# Patient Record
Sex: Female | Born: 1970 | Race: White | Hispanic: No | Marital: Married | Smoking: Never smoker
Health system: Southern US, Community
[De-identification: ages and names within clinical notes are randomized; demographics above are authoritative.]

## PROBLEM LIST (undated history)

## (undated) DIAGNOSIS — E039 Hypothyroidism, unspecified: Secondary | ICD-10-CM

## (undated) HISTORY — PX: APPENDECTOMY: SHX54

## (undated) HISTORY — PX: OTHER SURGICAL HISTORY: SHX169

## (undated) HISTORY — PX: ABDOMINAL HYSTERECTOMY: SHX81

---

## 1999-04-29 ENCOUNTER — Other Ambulatory Visit: Admission: RE | Admit: 1999-04-29 | Discharge: 1999-04-29 | Payer: Self-pay | Admitting: Family Medicine

## 2001-02-03 ENCOUNTER — Ambulatory Visit (HOSPITAL_BASED_OUTPATIENT_CLINIC_OR_DEPARTMENT_OTHER): Admission: RE | Admit: 2001-02-03 | Discharge: 2001-02-03 | Payer: Self-pay | Admitting: General Surgery

## 2001-02-03 ENCOUNTER — Encounter (INDEPENDENT_AMBULATORY_CARE_PROVIDER_SITE_OTHER): Payer: Self-pay | Admitting: Specialist

## 2001-11-15 ENCOUNTER — Encounter: Payer: Self-pay | Admitting: General Surgery

## 2001-11-15 ENCOUNTER — Encounter: Admission: RE | Admit: 2001-11-15 | Discharge: 2001-11-15 | Payer: Self-pay | Admitting: General Surgery

## 2003-08-15 ENCOUNTER — Other Ambulatory Visit: Admission: RE | Admit: 2003-08-15 | Discharge: 2003-08-15 | Payer: Self-pay | Admitting: Family Medicine

## 2004-08-26 ENCOUNTER — Other Ambulatory Visit: Admission: RE | Admit: 2004-08-26 | Discharge: 2004-08-26 | Payer: Self-pay | Admitting: Family Medicine

## 2005-09-08 ENCOUNTER — Other Ambulatory Visit: Admission: RE | Admit: 2005-09-08 | Discharge: 2005-09-08 | Payer: Self-pay | Admitting: Family Medicine

## 2006-09-14 ENCOUNTER — Other Ambulatory Visit: Admission: RE | Admit: 2006-09-14 | Discharge: 2006-09-14 | Payer: Self-pay | Admitting: Family Medicine

## 2007-09-27 ENCOUNTER — Other Ambulatory Visit: Admission: RE | Admit: 2007-09-27 | Discharge: 2007-09-27 | Payer: Self-pay | Admitting: Family Medicine

## 2008-10-02 ENCOUNTER — Other Ambulatory Visit: Admission: RE | Admit: 2008-10-02 | Discharge: 2008-10-02 | Payer: Self-pay | Admitting: Family Medicine

## 2008-11-29 ENCOUNTER — Ambulatory Visit (HOSPITAL_COMMUNITY): Admission: RE | Admit: 2008-11-29 | Discharge: 2008-11-29 | Payer: Self-pay | Admitting: Obstetrics

## 2009-02-01 ENCOUNTER — Encounter (INDEPENDENT_AMBULATORY_CARE_PROVIDER_SITE_OTHER): Payer: Self-pay | Admitting: Obstetrics

## 2009-02-01 ENCOUNTER — Ambulatory Visit (HOSPITAL_COMMUNITY): Admission: RE | Admit: 2009-02-01 | Discharge: 2009-02-01 | Payer: Self-pay | Admitting: Obstetrics

## 2010-05-23 ENCOUNTER — Other Ambulatory Visit: Admission: RE | Admit: 2010-05-23 | Discharge: 2010-05-23 | Payer: Self-pay | Admitting: Family Medicine

## 2010-07-10 ENCOUNTER — Ambulatory Visit (HOSPITAL_COMMUNITY)
Admission: RE | Admit: 2010-07-10 | Discharge: 2010-07-10 | Payer: Self-pay | Source: Home / Self Care | Attending: Family Medicine | Admitting: Family Medicine

## 2010-11-02 LAB — CBC
HCT: 42.3 % (ref 36.0–46.0)
Hemoglobin: 14.7 g/dL (ref 12.0–15.0)
MCHC: 34.7 g/dL (ref 30.0–36.0)
MCV: 88.6 fL (ref 78.0–100.0)
Platelets: 300 10*3/uL (ref 150–400)
RBC: 4.77 MIL/uL (ref 3.87–5.11)
RDW: 12.7 % (ref 11.5–15.5)
WBC: 9 10*3/uL (ref 4.0–10.5)

## 2010-11-02 LAB — BASIC METABOLIC PANEL
BUN: 5 mg/dL — ABNORMAL LOW (ref 6–23)
CO2: 27 mEq/L (ref 19–32)
Calcium: 8.9 mg/dL (ref 8.4–10.5)
Chloride: 105 mEq/L (ref 96–112)
Creatinine, Ser: 0.65 mg/dL (ref 0.4–1.2)
GFR calc Af Amer: >60 mL/min (ref >60)
GFR calc non Af Amer: >60 mL/min (ref >60)
Glucose, Bld: 94 mg/dL (ref 70–99)
Potassium: 4.2 mEq/L (ref 3.5–5.1)
Sodium: 138 mEq/L (ref 135–145)

## 2010-11-02 LAB — ABO/RH: ABO/RH(D): B POS

## 2010-11-02 LAB — TYPE AND SCREEN: Antibody Screen: NEGATIVE

## 2010-11-02 LAB — URINALYSIS, ROUTINE W REFLEX MICROSCOPIC
Hgb urine dipstick: NEGATIVE
Ketones, ur: NEGATIVE mg/dL
Protein, ur: NEGATIVE mg/dL
Urobilinogen, UA: 0.2 mg/dL (ref 0.0–1.0)

## 2010-11-05 ENCOUNTER — Ambulatory Visit (HOSPITAL_COMMUNITY)
Admission: RE | Admit: 2010-11-05 | Discharge: 2010-11-05 | Disposition: A | Discharge status: Home / Self Care | Payer: BC Managed Care – PPO | Source: Ambulatory Visit | Attending: Obstetrics and Gynecology | Admitting: Obstetrics and Gynecology

## 2010-11-05 ENCOUNTER — Other Ambulatory Visit: Payer: Self-pay | Admitting: Obstetrics and Gynecology

## 2010-11-05 ENCOUNTER — Ambulatory Visit (HOSPITAL_COMMUNITY): Payer: BC Managed Care – PPO

## 2010-11-05 DIAGNOSIS — N8000 Endometriosis of the uterus, unspecified: Secondary | ICD-10-CM | POA: Insufficient documentation

## 2010-11-05 DIAGNOSIS — N979 Female infertility, unspecified: Secondary | ICD-10-CM

## 2010-11-05 DIAGNOSIS — D250 Submucous leiomyoma of uterus: Secondary | ICD-10-CM | POA: Insufficient documentation

## 2010-11-05 LAB — CBC
HCT: 42.7 % (ref 36.0–46.0)
MCH: 29.2 pg (ref 26.0–34.0)
MCHC: 33.5 g/dL (ref 30.0–36.0)
MCV: 87.3 fL (ref 78.0–100.0)
Platelets: 307 10*3/uL (ref 150–400)
RDW: 12.5 % (ref 11.5–15.5)
WBC: 11.9 10*3/uL — ABNORMAL HIGH (ref 4.0–10.5)

## 2010-12-09 NOTE — Op Note (Signed)
NAMETAMEIKA, HECKMANN                ACCOUNT NO.:  [id number]   MEDICAL RECORD NO.:  [id number]          PATIENT TYPE:  AMB   LOCATION:  SDC                           FACILITY:  [facility]   PHYSICIAN:  Lendon Colonel, MD   DATE OF BIRTH:  August 16, 1970   DATE OF PROCEDURE:  02/01/2009  DATE OF DISCHARGE:  02/01/2009                               OPERATIVE REPORT   PREOPERATIVE DIAGNOSIS:  Menorrhagia, submucosal fibroid.   POSTOPERATIVE DIAGNOSIS:  Menorrhagia, submucosal fibroid.   PROCEDURE:  Operative hysteroscopy, resectoscope.   SURGEON:  Alphonsus Sias. Ernestina Penna, MD   ASSISTANT:  None.   ANESTHESIA:  Epidural.   FINDINGS:  Normal cervix, uterus was sounded to 9 cm.  Normal left  ostia. Visualization of the right ostia was blocked by submucosal  fibroid. Somewhat thickened endometrium.  No fundal distortion from the  larger intramural fibroid,  3-cm fibroid with a 0.5 cm stalk in the  right cornu.   SPECIMENS:  Fibroids, EMC.   DISPOSITION:  To Pathology.   ANTIBIOTICS:  100 mg of IV doxycycline.   ESTIMATED BLOOD LOSS:  Minimal.   FLUIDS:  90 mL hysteroscopic saline deficit and 580 hysteroscopic  glycine deficit.   COMPLICATIONS:  None.   INDICATIONS:  This is a 40 year old G0 with history of menorrhagia and  found to have a 3-cm submucosal fibroid on sonohystogram.   DESCRIPTION OF PROCEDURE:  After informed consent was obtained, the  patient was taken to the operating room where epidural anesthesia was  initiated without difficulty.  She was prepped and draped in normal  sterile fashion in dorsal supine lithotomy position.  A speculum was  inserted into the vagina.  Single-toothed tenaculum was used to grasp  the anterior lip of cervix and solution 6 units of vasopressin mixed ino  30 mL of 0.5% Marcaine was used, 10 mL of the solution was infused at 5  and 7 o'clock in the cervical and paracervical junction.  The uterus was  sounded, was serially dilated, and then a small  hysteroscope was used to  evaluate the uterus.  Several minutes were spent trying to visualize the  right ostia, however, this was unsuccessful.  The remainder of the  findings were as noted earlier in the dictation.  The hysteroscope was  removed.  The resectoscope was inserted with a 90-degree right angle  loop, attempts at transecting the fibroid at its stalk was done.  The  majority of the stalk was transected, however, the fibroid was unable to  be amputated from the uterus.  Given increasing fluid deficit and  concern for inability to complete the procedure, the right angle loop  was used in traditional fashion to serially resect the fibroid, about  60% of the fibroid was removed.  The resectoscope was removed in and out  of the cervix multiple times. This was needed for cleaning and to remove  chips of the fibroid.  Total procedure time went about 1 hour.  After  the glycine deficit had reached over 500 mL, the resectoscope portion of  the procedure was terminated.  Given  there was still some fibroid  remaining, saline was used with a small operative hysteroscope with  attempts to further transect the fibroid, a small amount of fibroid was  removed with this as well as bluntly placing a polyp forceps and  curettage of the endometrium.  At this point, the procedure was  terminated.  Tenaculum was removed.  Silver nitrate was placed on the  tenaculum site.  The patient was awoke in the procedure.  The patient  tolerated well and was taken to recovery room in stable condition.      Lendon Colonel, MD  Electronically Signed     KAF/MEDQ  D:  02/01/2009  T:  02/02/2009  Job:  [id number]

## 2010-12-12 NOTE — Op Note (Signed)
[facility]  Patient:    Kathleen Stephenson, Kathleen Stephenson                         MRN: [id number] Proc. Date: 02/03/01 Adm. Date:  [id number] Disc. Date: [id number] Attending:  Caleen Essex                           Operative Report  PREOPERATIVE DIAGNOSIS:  Mass in the left lower back.  POSTOPERATIVE DIAGNOSIS:  Mass in the left lower back.  OPERATION PERFORMED:  Excision of a 2 cm mass in the left back.  SURGEON:  Chevis Pretty, M.D.  ANESTHESIA:  Local.  DESCRIPTION OF PROCEDURE:  After informed consent was obtained, the patient was brought to the operating room and placed in the prone position on the operating table.  The patients area in question on the left back was prepped with Betadine and draped in the usual sterile manner.  The area in question was then infiltrated with 1% lidocaine with epinephrine.  This was then massaged into the tissues for several minutes.  A small transverse incision was made overtop of the palpable mass.  The incision was carried down through the skin and subcutaneous using both sharp dissection with the bipolar electrocautery and blunt dissection with a hemostat until the area in question was identified.  The area in question had the appearance of a lipoma. Everything that was palpable was removed with a combination of blunt dissection with the Metzenbaum scissors.  The wound was then evaluated and found to be hemostatic.  The deep layer was closed with interrupted with a running 3-0 Vicryl stitch and the skin incision was closed with running 4-0 Monocryl subcuticular stitch.  Benzoin and Steri-Strips and a sterile dressing was applied.  The patient tolerated the procedure well.  At the end of the case all sponge, needle and instrument counts were verified correct at the completion of the operation.  The patient was taken to the recovery room in stable condition. DD:  02/07/01 TD:  02/07/01 Job: [id number]

## 2011-02-27 NOTE — Op Note (Signed)
Kathleen Stephenson, BOERNER                ACCOUNT NO.:  [id number]  MEDICAL RECORD NO.:  [id number]           PATIENT TYPE:  O  LOCATION:  [facility]                          [facility]ILITY:  [facility]  PHYSICIAN:  Fermin Schwab, MD   DATE OF BIRTH:  [birth year]  DATE OF PROCEDURE:  11/05/2010 DATE OF DISCHARGE:                              OPERATIVE REPORT   PREOPERATIVE DIAGNOSIS:  Submucosal myoma versus intrauterine adhesion.  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  PROCEDURES:  Hysteroscopy.  Intraoperative HSG.  Resection of submucosal adenomyoma.  SURGEON:  Fermin Schwab, MD  ANESTHESIA:  General.  FINDINGS:  On exam under anesthesia, the external genitalia, Bartholin, Skene, urethra were normal.  The vagina was normal.  Cervix appeared grossly normal.  On hysteroscopy, endocervical canal was normal.  The endometrial cavity for was arcuate.  The left cornual region was normal. The left tubal ostium was visualized.  The right tubal ostium was covered with a 1.5 x 1.5 cm oval dense structure that was connected/adherent to the anterior fundal region as well as posterior lateral aspect of the right cornu.  Upon dissection VersaPoint resectoscope, this turned out to be a adenomyoma.  On intraoperative HSG, the endometrial cavity seemed to be obliterated by a quarter of its surface area by this right cornual filling defect, although the contrast went behind it and into the right tube and the right tube spilled.  The left tube filled up to mid ampulla in a sluggish manner, but did not spill.  There was no left tubal distention. This was followed using a technical problem.  DESCRIPTION OF PROCEDURE:  The patient was placed in lithotomy position. General LMA anesthesia was given.  A 2 grams of cefoxitin were given intravenously for prophylaxis.  PROCEDURE:  The patient was prepped and draped in sterile manner with 0.4 units per mL  of dilute vasopressin injection was given to the cervix at 30 degree slimline hysteroscope with normal saline distention medium via hysteroscopic pump was used for hysteroscopy above findings were noted.  Next, the uterus sounded to 11 cm, and its base was dilated to 30-French with Shawnie Pons dilators.  The 12-degree VersaPoint resectoscope was inserted 90 using normal saline.  The mass was resected with careful sleeves.  When I reached the center of it, a large amount of dark brown, thick material escaped indicating that this was cystic adenomyoma.  The location of this mass corresponded to the site where 2 previous submucosal myoma sections were performed.  Of interest, the mass appeared to be attached both fundally anteriorly and also posterior and laterally leaving a recess that went into the right tubal ostium. Multiple sequences of uterine deflation and reinflation with distention media was performed in order to encourage any intramural portion of this mass to ooze out.  Although resection could be carried out to the normal myometrium superiorly, at the posterior and lateral aspect, normal tissue could not be reached.  For the sake of uterine integrity, resection in this direction was stopped and the procedure was terminated.  The small chips of tissue were collected and  sent to Pathology.  A Seprafilm sheath was rolled up and inserted into the upper uterine cavity.  Estimated blood loss was minimal.  The patient tolerated the procedure well and was transferred to recovery room in satisfactory condition.     Fermin Schwab, MD     TY/MEDQ  D:  11/05/2010  T:  11/06/2010  Job:  [id number]  cc:   Huel Cote, M.D. Fax: [phone]  Electronically Signed by Fermin Schwab MD on 02/27/2011 04:11:27 PM

## 2011-08-04 ENCOUNTER — Other Ambulatory Visit (HOSPITAL_COMMUNITY): Payer: Self-pay | Admitting: Family Medicine

## 2011-08-04 DIAGNOSIS — Z1231 Encounter for screening mammogram for malignant neoplasm of breast: Secondary | ICD-10-CM

## 2011-09-01 ENCOUNTER — Ambulatory Visit (HOSPITAL_COMMUNITY)
Admission: RE | Admit: 2011-09-01 | Discharge: 2011-09-01 | Disposition: A | Discharge status: Home / Self Care | Payer: BC Managed Care – PPO | Source: Ambulatory Visit | Attending: Family Medicine | Admitting: Family Medicine

## 2011-09-01 DIAGNOSIS — Z1231 Encounter for screening mammogram for malignant neoplasm of breast: Secondary | ICD-10-CM

## 2012-01-22 ENCOUNTER — Other Ambulatory Visit: Payer: Self-pay | Admitting: Family Medicine

## 2012-01-22 ENCOUNTER — Other Ambulatory Visit (HOSPITAL_COMMUNITY)
Admission: RE | Admit: 2012-01-22 | Discharge: 2012-01-22 | Disposition: A | Discharge status: Home / Self Care | Payer: BC Managed Care – PPO | Source: Ambulatory Visit | Attending: Family Medicine | Admitting: Family Medicine

## 2012-01-22 DIAGNOSIS — Z124 Encounter for screening for malignant neoplasm of cervix: Secondary | ICD-10-CM | POA: Insufficient documentation

## 2012-09-06 ENCOUNTER — Other Ambulatory Visit: Payer: Self-pay | Admitting: Family Medicine

## 2013-01-02 ENCOUNTER — Other Ambulatory Visit (HOSPITAL_COMMUNITY): Payer: Self-pay | Admitting: Family Medicine

## 2013-01-02 DIAGNOSIS — Z1231 Encounter for screening mammogram for malignant neoplasm of breast: Secondary | ICD-10-CM

## 2013-01-10 ENCOUNTER — Ambulatory Visit (HOSPITAL_COMMUNITY)
Admission: RE | Admit: 2013-01-10 | Discharge: 2013-01-10 | Disposition: A | Discharge status: Home / Self Care | Payer: BC Managed Care – PPO | Source: Ambulatory Visit | Attending: Family Medicine | Admitting: Family Medicine

## 2013-01-10 DIAGNOSIS — Z1231 Encounter for screening mammogram for malignant neoplasm of breast: Secondary | ICD-10-CM | POA: Insufficient documentation

## 2013-01-25 ENCOUNTER — Other Ambulatory Visit: Payer: Self-pay | Admitting: Family Medicine

## 2013-01-25 ENCOUNTER — Other Ambulatory Visit (HOSPITAL_COMMUNITY)
Admission: RE | Admit: 2013-01-25 | Discharge: 2013-01-25 | Disposition: A | Discharge status: Home / Self Care | Payer: BC Managed Care – PPO | Source: Ambulatory Visit | Attending: Family Medicine | Admitting: Family Medicine

## 2013-01-25 DIAGNOSIS — Z124 Encounter for screening for malignant neoplasm of cervix: Secondary | ICD-10-CM | POA: Insufficient documentation

## 2014-01-08 ENCOUNTER — Other Ambulatory Visit (HOSPITAL_COMMUNITY): Payer: Self-pay | Admitting: Family Medicine

## 2014-01-08 DIAGNOSIS — Z1231 Encounter for screening mammogram for malignant neoplasm of breast: Secondary | ICD-10-CM

## 2014-01-17 ENCOUNTER — Ambulatory Visit (HOSPITAL_COMMUNITY)
Admission: RE | Admit: 2014-01-17 | Discharge: 2014-01-17 | Disposition: A | Discharge status: Home / Self Care | Payer: BC Managed Care – PPO | Source: Ambulatory Visit | Attending: Family Medicine | Admitting: Family Medicine

## 2014-01-17 DIAGNOSIS — Z1231 Encounter for screening mammogram for malignant neoplasm of breast: Secondary | ICD-10-CM

## 2015-02-27 ENCOUNTER — Other Ambulatory Visit (HOSPITAL_COMMUNITY): Payer: Self-pay | Admitting: Family Medicine

## 2015-02-27 DIAGNOSIS — R102 Pelvic and perineal pain: Secondary | ICD-10-CM

## 2015-03-05 ENCOUNTER — Other Ambulatory Visit (HOSPITAL_COMMUNITY): Payer: Self-pay | Admitting: Family Medicine

## 2015-03-05 DIAGNOSIS — Z1231 Encounter for screening mammogram for malignant neoplasm of breast: Secondary | ICD-10-CM

## 2015-03-08 ENCOUNTER — Ambulatory Visit (HOSPITAL_COMMUNITY): Payer: BC Managed Care – PPO

## 2015-03-13 ENCOUNTER — Ambulatory Visit (HOSPITAL_COMMUNITY)
Admission: RE | Admit: 2015-03-13 | Discharge: 2015-03-13 | Disposition: A | Discharge status: Home / Self Care | Payer: BC Managed Care – PPO | Source: Ambulatory Visit | Attending: Family Medicine | Admitting: Family Medicine

## 2015-03-13 DIAGNOSIS — R102 Pelvic and perineal pain: Secondary | ICD-10-CM

## 2015-03-13 DIAGNOSIS — D252 Subserosal leiomyoma of uterus: Secondary | ICD-10-CM | POA: Insufficient documentation

## 2015-03-20 ENCOUNTER — Ambulatory Visit (HOSPITAL_COMMUNITY)
Admission: RE | Admit: 2015-03-20 | Discharge: 2015-03-20 | Disposition: A | Discharge status: Home / Self Care | Payer: BC Managed Care – PPO | Source: Ambulatory Visit | Attending: Family Medicine | Admitting: Family Medicine

## 2015-03-20 DIAGNOSIS — Z1231 Encounter for screening mammogram for malignant neoplasm of breast: Secondary | ICD-10-CM | POA: Insufficient documentation

## 2016-02-10 ENCOUNTER — Other Ambulatory Visit (HOSPITAL_COMMUNITY)
Admission: RE | Admit: 2016-02-10 | Discharge: 2016-02-10 | Disposition: A | Discharge status: Home / Self Care | Payer: BC Managed Care – PPO | Source: Ambulatory Visit | Attending: Family Medicine | Admitting: Family Medicine

## 2016-02-10 ENCOUNTER — Other Ambulatory Visit: Payer: Self-pay | Admitting: Family Medicine

## 2016-02-10 DIAGNOSIS — Z01411 Encounter for gynecological examination (general) (routine) with abnormal findings: Secondary | ICD-10-CM | POA: Insufficient documentation

## 2016-02-10 DIAGNOSIS — Z1151 Encounter for screening for human papillomavirus (HPV): Secondary | ICD-10-CM | POA: Insufficient documentation

## 2016-02-11 LAB — CYTOLOGY - PAP

## 2016-05-14 ENCOUNTER — Other Ambulatory Visit: Payer: Self-pay | Admitting: Family Medicine

## 2016-05-14 DIAGNOSIS — Z1231 Encounter for screening mammogram for malignant neoplasm of breast: Secondary | ICD-10-CM

## 2016-06-04 ENCOUNTER — Ambulatory Visit
Admission: RE | Admit: 2016-06-04 | Discharge: 2016-06-04 | Disposition: A | Discharge status: Home / Self Care | Payer: BC Managed Care – PPO | Source: Ambulatory Visit | Attending: Family Medicine | Admitting: Family Medicine

## 2016-06-04 DIAGNOSIS — Z1231 Encounter for screening mammogram for malignant neoplasm of breast: Secondary | ICD-10-CM

## 2017-05-25 ENCOUNTER — Other Ambulatory Visit: Payer: Self-pay | Admitting: Family Medicine

## 2017-05-25 DIAGNOSIS — Z1231 Encounter for screening mammogram for malignant neoplasm of breast: Secondary | ICD-10-CM

## 2017-06-16 ENCOUNTER — Ambulatory Visit: Payer: BC Managed Care – PPO

## 2017-06-22 ENCOUNTER — Ambulatory Visit
Admission: RE | Admit: 2017-06-22 | Discharge: 2017-06-22 | Disposition: A | Discharge status: Home / Self Care | Payer: BC Managed Care – PPO | Source: Ambulatory Visit | Attending: Family Medicine | Admitting: Family Medicine

## 2017-06-22 DIAGNOSIS — Z1231 Encounter for screening mammogram for malignant neoplasm of breast: Secondary | ICD-10-CM

## 2018-05-26 ENCOUNTER — Other Ambulatory Visit: Payer: Self-pay | Admitting: Family Medicine

## 2018-05-26 DIAGNOSIS — Z1231 Encounter for screening mammogram for malignant neoplasm of breast: Secondary | ICD-10-CM

## 2018-07-12 ENCOUNTER — Ambulatory Visit
Admission: RE | Admit: 2018-07-12 | Discharge: 2018-07-12 | Disposition: A | Discharge status: Home / Self Care | Payer: BC Managed Care – PPO | Source: Ambulatory Visit | Attending: Family Medicine | Admitting: Family Medicine

## 2018-07-12 DIAGNOSIS — Z1231 Encounter for screening mammogram for malignant neoplasm of breast: Secondary | ICD-10-CM

## 2019-03-31 ENCOUNTER — Other Ambulatory Visit (HOSPITAL_COMMUNITY)
Admission: RE | Admit: 2019-03-31 | Discharge: 2019-03-31 | Disposition: A | Discharge status: Home / Self Care | Payer: BC Managed Care – PPO | Source: Ambulatory Visit | Attending: Obstetrics and Gynecology | Admitting: Obstetrics and Gynecology

## 2019-03-31 ENCOUNTER — Other Ambulatory Visit: Payer: Self-pay | Admitting: Obstetrics and Gynecology

## 2019-03-31 DIAGNOSIS — Z01419 Encounter for gynecological examination (general) (routine) without abnormal findings: Secondary | ICD-10-CM | POA: Insufficient documentation

## 2019-04-05 LAB — CYTOLOGY - PAP
Diagnosis: NEGATIVE
HPV: NOT DETECTED

## 2019-06-26 ENCOUNTER — Other Ambulatory Visit: Payer: Self-pay | Admitting: Family Medicine

## 2019-06-26 DIAGNOSIS — Z1231 Encounter for screening mammogram for malignant neoplasm of breast: Secondary | ICD-10-CM

## 2019-08-15 ENCOUNTER — Other Ambulatory Visit: Payer: Self-pay

## 2019-08-15 ENCOUNTER — Ambulatory Visit
Admission: RE | Admit: 2019-08-15 | Discharge: 2019-08-15 | Disposition: A | Discharge status: Home / Self Care | Payer: BC Managed Care – PPO | Source: Ambulatory Visit | Attending: Family Medicine | Admitting: Family Medicine

## 2019-08-15 DIAGNOSIS — Z1231 Encounter for screening mammogram for malignant neoplasm of breast: Secondary | ICD-10-CM

## 2020-01-08 ENCOUNTER — Encounter (HOSPITAL_COMMUNITY)
Admission: RE | Admit: 2020-01-08 | Discharge: 2020-01-08 | Disposition: A | Discharge status: Home / Self Care | Payer: BC Managed Care – PPO | Source: Ambulatory Visit | Attending: Obstetrics and Gynecology | Admitting: Obstetrics and Gynecology

## 2020-01-08 ENCOUNTER — Encounter (HOSPITAL_COMMUNITY): Payer: Self-pay

## 2020-01-08 ENCOUNTER — Encounter (HOSPITAL_COMMUNITY): Payer: BC Managed Care – PPO

## 2020-01-08 ENCOUNTER — Other Ambulatory Visit: Payer: Self-pay

## 2020-01-08 DIAGNOSIS — Z01812 Encounter for preprocedural laboratory examination: Secondary | ICD-10-CM | POA: Insufficient documentation

## 2020-01-08 HISTORY — DX: Hypothyroidism, unspecified: E03.9

## 2020-01-08 NOTE — Progress Notes (Signed)
COVID Vaccine Completed:yes Date COVID Vaccine completed:09/27/19 COVID vaccine manufacturer: [place]   PCP - Maurice Small Cardiologist -   Chest x-ray -  EKG -  Stress Test -  ECHO -  Cardiac Cath -   Sleep Study -  CPAP -   Fasting Blood Sugar -  Checks Blood Sugar _____ times a day  Blood Thinner Instructions: Aspirin Instructions: Last Dose:  Anesthesia review:   Patient denies shortness of breath, fever, cough and chest pain at PAT appointment   Patient verbalized understanding of instructions that were given to them at the PAT appointment. Patient was also instructed that they will need to review over the PAT instructions again at home before surgery.

## 2020-01-08 NOTE — Patient Instructions (Signed)
DUE TO COVID-19 ONLY ONE VISITOR IS ALLOWED IN WAITING ROOM (VISITOR WILL HAVE A TEMPERATURE CHECK ON ARRIVAL AND MUST WEAR A FACE MASK THE ENTIRE TIME.)  ONCE YOU ARE ADMITTED TO YOUR PRIVATE ROOM, THE SAME ONE VISITOR IS ALLOWED TO VISIT DURING VISITING HOURS ONLY.  Your COVID swab testing is scheduled for: 01/11/20  At: 2:30 pm  , You must self quarantine after your testing per handout given to you at the testing site.  ([facility] up testing enter pre-surgical testing line)    Your procedure is scheduled on: 01/15/20  Report to [facility] AT: 5:30  A. M.   Call this number if you have problems the morning of [phone].   OUR ADDRESS IS [address].  WE ARE LOCATED IN THE [place]                                   [place] [place].                                     REMEMBER:   DO NOT EAT FOOD OR DRINK LIQUIDS AFTER MIDNIGHT .    BRUSH YOUR TEETH THE MORNING OF SURGERY.  TAKE THESE MEDICATIONS MORNING OF SURGERY WITH A SIP OF WATER:  Levothyroxine,loratadine.  DO NOT WEAR JEWERLY, MAKE UP, OR NAIL POLISH.  DO NOT WEAR LOTIONS, POWDERS, PERFUMES/COLOGNE OR DEODORANT.  DO NOT SHAVE FOR 24 HOURS PRIOR TO DAY OF SURGERY.    CONTACTS, GLASSES, OR DENTURES MAY NOT BE WORN TO SURGERY.                                    [facility] IS NOT RESPONSIBLE  FOR ANY BELONGINGS.          BRING ALL PRESCRIPTION MEDICATIONS WITH YOU THE DAY OF SURGERY IN ORIGINAL CONTAINERS                               YOU MAY BRING A SMALL OVERNIGHT [facility] - Preparing for Surgery Before surgery, you can play an important role.  Because skin is not sterile, your skin needs to be as free of germs as possible.  You can reduce the number of germs on your skin by washing with CHG (chlorahexidine gluconate) soap before surgery.  CHG is an antiseptic cleaner which kills germs and  bonds with the skin to continue killing germs even after washing. Please DO NOT use if you have an allergy to CHG or antibacterial soaps.  If your skin becomes reddened/irritated stop using the CHG and inform your nurse when you arrive at Short Stay. Do not shave (including legs and underarms) for at least 48 hours prior to the first CHG shower.  You may shave your face/neck. Please follow these instructions carefully:  1.  Shower with CHG Soap the night before surgery and the  morning of Surgery.  2.  If you choose to wash your  hair, wash your hair first as usual with your  normal  shampoo.  3.  After you shampoo, rinse your hair and body thoroughly to remove the  shampoo.                           4.  Use CHG as you would any other liquid soap.  You can apply chg directly  to the skin and wash                       Gently with a scrungie or clean washcloth.  5.  Apply the CHG Soap to your body ONLY FROM THE NECK DOWN.   Do not use on face/ open                           Wound or open sores. Avoid contact with eyes, ears mouth and genitals (private parts).                       Wash face,  Genitals (private parts) with your normal soap.             6.  Wash thoroughly, paying special attention to the area where your surgery  will be performed.  7.  Thoroughly rinse your body with warm water from the neck down.  8.  DO NOT shower/wash with your normal soap after using and rinsing off  the CHG Soap.                9.  Pat yourself dry with a clean towel.            10.  Wear clean pajamas.            11.  Place clean sheets on your bed the night of your first shower and do not  sleep with pets. Day of Surgery : Do not apply any lotions/deodorants the morning of surgery.  Please wear clean clothes to the hospital/surgery center.  FAILURE TO FOLLOW THESE INSTRUCTIONS MAY RESULT IN THE CANCELLATION OF YOUR SURGERY PATIENT SIGNATURE_________________________________  NURSE  SIGNATURE__________________________________  ________________________________________________________________________

## 2020-01-09 ENCOUNTER — Encounter (HOSPITAL_COMMUNITY)
Admission: RE | Admit: 2020-01-09 | Discharge: 2020-01-09 | Disposition: A | Discharge status: Home / Self Care | Payer: BC Managed Care – PPO | Source: Ambulatory Visit | Attending: Obstetrics and Gynecology | Admitting: Obstetrics and Gynecology

## 2020-01-09 DIAGNOSIS — Z01812 Encounter for preprocedural laboratory examination: Secondary | ICD-10-CM | POA: Diagnosis not present

## 2020-01-09 LAB — CBC
HCT: 39.6 % (ref 36.0–46.0)
Hemoglobin: 12.6 g/dL (ref 12.0–15.0)
MCH: 28.8 pg (ref 26.0–34.0)
MCHC: 31.8 g/dL (ref 30.0–36.0)
MCV: 90.4 fL (ref 80.0–100.0)
Platelets: 309 10*3/uL (ref 150–400)
RBC: 4.38 MIL/uL (ref 3.87–5.11)
RDW: 13.1 % (ref 11.5–15.5)
WBC: 7.9 10*3/uL (ref 4.0–10.5)
nRBC: 0 % (ref 0.0–0.2)

## 2020-01-09 LAB — COMPREHENSIVE METABOLIC PANEL
ALT: 45 U/L — ABNORMAL HIGH (ref 0–44)
AST: 28 U/L (ref 15–41)
Albumin: 4.2 g/dL (ref 3.5–5.0)
Alkaline Phosphatase: 70 U/L (ref 38–126)
Anion gap: 9 (ref 5–15)
BUN: 12 mg/dL (ref 6–20)
CO2: 26 mmol/L (ref 22–32)
Calcium: 8.9 mg/dL (ref 8.9–10.3)
Chloride: 106 mmol/L (ref 98–111)
Creatinine, Ser: 0.76 mg/dL (ref 0.44–1.00)
GFR calc Af Amer: >60 mL/min (ref >60)
GFR calc non Af Amer: >60 mL/min (ref >60)
Glucose, Bld: 122 mg/dL — ABNORMAL HIGH (ref 70–99)
Potassium: 4.2 mmol/L (ref 3.5–5.1)
Sodium: 141 mmol/L (ref 135–145)
Total Bilirubin: 0.3 mg/dL (ref 0.3–1.2)
Total Protein: 7.2 g/dL (ref 6.5–8.1)

## 2020-01-09 LAB — ABO/RH: ABO/RH(D): B POS

## 2020-01-11 ENCOUNTER — Other Ambulatory Visit (HOSPITAL_COMMUNITY)
Admission: RE | Admit: 2020-01-11 | Discharge: 2020-01-11 | Disposition: A | Discharge status: Home / Self Care | Payer: BC Managed Care – PPO | Source: Ambulatory Visit | Attending: Obstetrics and Gynecology | Admitting: Obstetrics and Gynecology

## 2020-01-11 DIAGNOSIS — Z20822 Contact with and (suspected) exposure to covid-19: Secondary | ICD-10-CM | POA: Insufficient documentation

## 2020-01-11 DIAGNOSIS — Z01812 Encounter for preprocedural laboratory examination: Secondary | ICD-10-CM | POA: Diagnosis present

## 2020-01-11 LAB — SARS CORONAVIRUS 2 (TAT 6-24 HRS): SARS Coronavirus 2: NEGATIVE

## 2020-01-12 NOTE — H&P (Signed)
Kathleen Stephenson is an 49 y.o. female. She has heavy painful menses. U/S in office noted uterus 15.2 x 9.4 x 12.0 cm with a 12 cm and a 5.6 cm and a 2.6 cm fibroids. Ovaries could not be visualized due to enlarged uterus.  Pertinent Gynecological History: Menses: flow is excessive with use of many pads or tampons on heaviest days Bleeding: dysfunctional uterine bleeding Contraception: none DES exposure: denies Blood transfusions: none Sexually transmitted diseases: no past history Previous GYN Procedures: H/S resection of fibroid 2010 Last mammogram: normal Date: 2021 Last pap: normal Date: 2010 OB History: G0, P0   Menstrual History: Menarche age: unknown Patient's last menstrual period was 12/22/2019.    Past Medical History:  Diagnosis Date  . Hypothyroidism     Past Surgical History:  Procedure Laterality Date  . APPENDECTOMY    . miomectomy      No family history on file.  Social History:  reports that she has never smoked. She has never used smokeless tobacco. She reports current alcohol use. She reports that she does not use drugs.  Allergies: No Known Allergies  No medications prior to admission.    Review of Systems  Constitutional: Negative for fever.    Last menstrual period 12/22/2019. Physical Exam  Cardiovascular: Normal rate and regular rhythm.  Respiratory: Effort normal and breath sounds normal.  GI: There is no abdominal tenderness.  Genitourinary:    Genitourinary Comments: Uterus enlarged   Musculoskeletal:     Cervical back: Normal range of motion.    Results for orders placed or performed during the hospital encounter of 01/11/20 (from the past 24 hour(s))  SARS CORONAVIRUS 2 (TAT 6-24 HRS) Nasopharyngeal Nasopharyngeal Swab     Status: None   Collection Time: 01/11/20  2:35 PM   Specimen: Nasopharyngeal Swab  Result Value Ref Range   SARS Coronavirus 2 NEGATIVE NEGATIVE    No results found.  Assessment/Plan: 49 yo with symptomatic  fibroids Total abdominal hysterectomy and bilateral salpingo oophorectomy is discussed Risks reviewed including infection, organ damage, bleeding/transfusion-HIV/Hep, DVT/PE, pneumonia, pain. She states she understands and agrees.  Shon Millet II 01/12/2020, 1:43 PM

## 2020-01-14 NOTE — Anesthesia Preprocedure Evaluation (Addendum)
Anesthesia Evaluation  Patient identified by MRN, date of birth, ID band Patient awake    Reviewed: Allergy & Precautions, NPO status , Patient's Chart, lab work & pertinent test results  History of Anesthesia Complications Negative for: history of anesthetic complications  Airway Mallampati: II  TM Distance: >3 FB Neck ROM: Full    Dental no notable dental hx.    Pulmonary neg pulmonary ROS,    Pulmonary exam normal        Cardiovascular negative cardio ROS Normal cardiovascular exam     Neuro/Psych Anxiety negative neurological ROS     GI/Hepatic negative GI ROS, Neg liver ROS,   Endo/Other  Hypothyroidism   Renal/GU negative Renal ROS  negative genitourinary   Musculoskeletal negative musculoskeletal ROS (+)   Abdominal   Peds  Hematology negative hematology ROS (+)   Anesthesia Other Findings Day of surgery medications reviewed with patient.  Reproductive/Obstetrics fibroids, menorrhagia, dysmenorrhea                            Anesthesia Physical Anesthesia Plan  ASA: II  Anesthesia Plan: General   Post-op Pain Management:    Induction: Intravenous  PONV Risk Score and Plan: 4 or greater and Treatment may vary due to age or medical condition, Ondansetron, Dexamethasone, Midazolam and Scopolamine patch - Pre-op  Airway Management Planned: Oral ETT  Additional Equipment: None  Intra-op Plan:   Post-operative Plan: Extubation in OR  Informed Consent: I have reviewed the patients History and Physical, chart, labs and discussed the procedure including the risks, benefits and alternatives for the proposed anesthesia with the patient or authorized representative who has indicated his/her understanding and acceptance.     Dental advisory given  Plan Discussed with: CRNA  Anesthesia Plan Comments:        Anesthesia Quick Evaluation

## 2020-01-15 ENCOUNTER — Other Ambulatory Visit: Payer: Self-pay

## 2020-01-15 ENCOUNTER — Observation Stay (HOSPITAL_BASED_OUTPATIENT_CLINIC_OR_DEPARTMENT_OTHER)
Admission: AD | Admit: 2020-01-15 | Discharge: 2020-01-16 | Disposition: A | Discharge status: Home / Self Care | Payer: BC Managed Care – PPO | Source: Ambulatory Visit | Attending: Obstetrics and Gynecology | Admitting: Obstetrics and Gynecology

## 2020-01-15 ENCOUNTER — Inpatient Hospital Stay (HOSPITAL_BASED_OUTPATIENT_CLINIC_OR_DEPARTMENT_OTHER): Payer: BC Managed Care – PPO | Admitting: Anesthesiology

## 2020-01-15 ENCOUNTER — Encounter (HOSPITAL_BASED_OUTPATIENT_CLINIC_OR_DEPARTMENT_OTHER): Payer: Self-pay | Admitting: Obstetrics and Gynecology

## 2020-01-15 ENCOUNTER — Encounter (HOSPITAL_BASED_OUTPATIENT_CLINIC_OR_DEPARTMENT_OTHER)
Admission: AD | Disposition: A | Discharge status: Home / Self Care | Payer: Self-pay | Source: Ambulatory Visit | Attending: Obstetrics and Gynecology

## 2020-01-15 DIAGNOSIS — N800 Endometriosis of uterus: Secondary | ICD-10-CM | POA: Diagnosis not present

## 2020-01-15 DIAGNOSIS — E039 Hypothyroidism, unspecified: Secondary | ICD-10-CM | POA: Insufficient documentation

## 2020-01-15 DIAGNOSIS — Z79890 Hormone replacement therapy (postmenopausal): Secondary | ICD-10-CM | POA: Diagnosis not present

## 2020-01-15 DIAGNOSIS — N946 Dysmenorrhea, unspecified: Secondary | ICD-10-CM | POA: Insufficient documentation

## 2020-01-15 DIAGNOSIS — D219 Benign neoplasm of connective and other soft tissue, unspecified: Principal | ICD-10-CM | POA: Diagnosis present

## 2020-01-15 DIAGNOSIS — Z79899 Other long term (current) drug therapy: Secondary | ICD-10-CM | POA: Diagnosis not present

## 2020-01-15 DIAGNOSIS — D259 Leiomyoma of uterus, unspecified: Secondary | ICD-10-CM | POA: Insufficient documentation

## 2020-01-15 DIAGNOSIS — N920 Excessive and frequent menstruation with regular cycle: Secondary | ICD-10-CM | POA: Insufficient documentation

## 2020-01-15 HISTORY — PX: HYSTERECTOMY ABDOMINAL WITH SALPINGO-OOPHORECTOMY: SHX6792

## 2020-01-15 LAB — TYPE AND SCREEN
ABO/RH(D): B POS
Antibody Screen: NEGATIVE

## 2020-01-15 LAB — POCT PREGNANCY, URINE: Preg Test, Ur: NEGATIVE

## 2020-01-15 SURGERY — HYSTERECTOMY, ABDOMINAL, WITH SALPINGO-OOPHORECTOMY
Anesthesia: General | Site: Abdomen | Laterality: Bilateral

## 2020-01-15 MED ORDER — LACTATED RINGERS IV SOLN
INTRAVENOUS | Status: DC
  Administered 2020-01-15: 125 mL/h via INTRAVENOUS

## 2020-01-15 MED ORDER — HYDROMORPHONE HCL 1 MG/ML IJ SOLN: 0.2000 mg | INTRAMUSCULAR | Status: DC | PRN

## 2020-01-15 MED ORDER — OXYCODONE HCL 5 MG PO TABS
ORAL_TABLET | ORAL | Status: AC
  Filled 2020-01-15: qty 1

## 2020-01-15 MED ORDER — PROPOFOL 10 MG/ML IV BOLUS
INTRAVENOUS | Status: AC
  Filled 2020-01-15: qty 20

## 2020-01-15 MED ORDER — KETOROLAC TROMETHAMINE 30 MG/ML IJ SOLN
INTRAMUSCULAR | Status: AC
  Filled 2020-01-15: qty 1

## 2020-01-15 MED ORDER — ACETAMINOPHEN 325 MG PO TABS
ORAL_TABLET | ORAL | Status: AC
  Filled 2020-01-15: qty 3

## 2020-01-15 MED ORDER — SCOPOLAMINE 1 MG/3DAYS TD PT72
1.0000 | MEDICATED_PATCH | Freq: Once | TRANSDERMAL | Status: DC
  Administered 2020-01-15: 1.5 mg via TRANSDERMAL

## 2020-01-15 MED ORDER — FENTANYL CITRATE (PF) 100 MCG/2ML IJ SOLN
INTRAMUSCULAR | Status: DC | PRN
  Administered 2020-01-15 (×5): 50 ug via INTRAVENOUS

## 2020-01-15 MED ORDER — PROPOFOL 10 MG/ML IV BOLUS
INTRAVENOUS | Status: DC | PRN
  Administered 2020-01-15: 200 mg via INTRAVENOUS

## 2020-01-15 MED ORDER — DROPERIDOL 2.5 MG/ML IJ SOLN: 0.6250 mg | Freq: Once | INTRAMUSCULAR | Status: DC | PRN

## 2020-01-15 MED ORDER — ONDANSETRON HCL 4 MG/2ML IJ SOLN
INTRAMUSCULAR | Status: AC
  Filled 2020-01-15: qty 2

## 2020-01-15 MED ORDER — METHOCARBAMOL 500 MG PO TABS
500.0000 mg | ORAL_TABLET | Freq: Four times a day (QID) | ORAL | Status: DC | PRN
  Administered 2020-01-15 (×2): 500 mg via ORAL

## 2020-01-15 MED ORDER — ACETAMINOPHEN 500 MG PO TABS
1000.0000 mg | ORAL_TABLET | Freq: Once | ORAL | Status: AC
  Administered 2020-01-15: 1000 mg via ORAL

## 2020-01-15 MED ORDER — GABAPENTIN 300 MG PO CAPS
ORAL_CAPSULE | ORAL | Status: AC
  Filled 2020-01-15: qty 1

## 2020-01-15 MED ORDER — FENTANYL CITRATE (PF) 250 MCG/5ML IJ SOLN
INTRAMUSCULAR | Status: AC
  Filled 2020-01-15: qty 5

## 2020-01-15 MED ORDER — BUPIVACAINE LIPOSOME 1.3 % IJ SUSP
INTRAMUSCULAR | Status: DC | PRN
  Administered 2020-01-15: 20 mL

## 2020-01-15 MED ORDER — MENTHOL 3 MG MT LOZG: 1.0000 | LOZENGE | OROMUCOSAL | Status: DC | PRN

## 2020-01-15 MED ORDER — SOD CITRATE-CITRIC ACID 500-334 MG/5ML PO SOLN: 30.0000 mL | ORAL | Status: DC

## 2020-01-15 MED ORDER — ONDANSETRON HCL 4 MG/2ML IJ SOLN
INTRAMUSCULAR | Status: DC | PRN
  Administered 2020-01-15: 4 mg via INTRAVENOUS

## 2020-01-15 MED ORDER — METHOCARBAMOL 500 MG PO TABS
ORAL_TABLET | ORAL | Status: AC
  Filled 2020-01-15: qty 1

## 2020-01-15 MED ORDER — OXYCODONE HCL 5 MG/5ML PO SOLN: 5.0000 mg | Freq: Once | ORAL | Status: AC | PRN

## 2020-01-15 MED ORDER — LACTATED RINGERS IV SOLN: INTRAVENOUS | Status: DC

## 2020-01-15 MED ORDER — BUPIVACAINE HCL 0.25 % IJ SOLN
INTRAMUSCULAR | Status: DC | PRN
Start: 2020-01-15 — End: 2020-01-15
  Administered 2020-01-15: 30 mL

## 2020-01-15 MED ORDER — LIDOCAINE HCL (CARDIAC) PF 100 MG/5ML IV SOSY
PREFILLED_SYRINGE | INTRAVENOUS | Status: DC | PRN
  Administered 2020-01-15: 60 mg via INTRAVENOUS

## 2020-01-15 MED ORDER — ROCURONIUM BROMIDE 10 MG/ML (PF) SYRINGE
PREFILLED_SYRINGE | INTRAVENOUS | Status: AC
  Filled 2020-01-15: qty 10

## 2020-01-15 MED ORDER — GABAPENTIN 300 MG PO CAPS
300.0000 mg | ORAL_CAPSULE | ORAL | Status: AC
  Administered 2020-01-15: 300 mg via ORAL

## 2020-01-15 MED ORDER — ONDANSETRON HCL 4 MG PO TABS: 4.0000 mg | ORAL_TABLET | Freq: Four times a day (QID) | ORAL | Status: DC | PRN

## 2020-01-15 MED ORDER — KETOROLAC TROMETHAMINE 30 MG/ML IJ SOLN
30.0000 mg | Freq: Once | INTRAMUSCULAR | Status: AC
  Administered 2020-01-15: 30 mg via INTRAVENOUS

## 2020-01-15 MED ORDER — LIDOCAINE 2% (20 MG/ML) 5 ML SYRINGE
INTRAMUSCULAR | Status: AC
  Filled 2020-01-15: qty 5

## 2020-01-15 MED ORDER — OXYCODONE HCL 5 MG PO TABS
5.0000 mg | ORAL_TABLET | Freq: Once | ORAL | Status: AC | PRN
  Administered 2020-01-15: 5 mg via ORAL

## 2020-01-15 MED ORDER — MAGNESIUM HYDROXIDE 400 MG/5ML PO SUSP
30.0000 mL | Freq: Every day | ORAL | Status: DC | PRN
  Filled 2020-01-15: qty 30

## 2020-01-15 MED ORDER — ONDANSETRON HCL 4 MG/2ML IJ SOLN
4.0000 mg | Freq: Four times a day (QID) | INTRAMUSCULAR | Status: DC | PRN
  Administered 2020-01-15: 4 mg via INTRAVENOUS

## 2020-01-15 MED ORDER — ROCURONIUM BROMIDE 100 MG/10ML IV SOLN
INTRAVENOUS | Status: DC | PRN
  Administered 2020-01-15: 60 mg via INTRAVENOUS
  Administered 2020-01-15: 10 mg via INTRAVENOUS

## 2020-01-15 MED ORDER — OXYCODONE HCL 5 MG PO TABS
5.0000 mg | ORAL_TABLET | ORAL | Status: DC | PRN
  Administered 2020-01-15 – 2020-01-16 (×2): 5 mg via ORAL

## 2020-01-15 MED ORDER — DEXAMETHASONE SODIUM PHOSPHATE 4 MG/ML IJ SOLN
INTRAMUSCULAR | Status: DC | PRN
  Administered 2020-01-15: 5 mg via INTRAVENOUS

## 2020-01-15 MED ORDER — FENTANYL CITRATE (PF) 100 MCG/2ML IJ SOLN
INTRAMUSCULAR | Status: AC
  Filled 2020-01-15: qty 2

## 2020-01-15 MED ORDER — KETOROLAC TROMETHAMINE 30 MG/ML IJ SOLN
30.0000 mg | Freq: Four times a day (QID) | INTRAMUSCULAR | Status: AC | PRN
  Administered 2020-01-15 – 2020-01-16 (×2): 30 mg via INTRAVENOUS

## 2020-01-15 MED ORDER — ALPRAZOLAM 0.5 MG PO TABS
ORAL_TABLET | ORAL | Status: AC
  Filled 2020-01-15: qty 1

## 2020-01-15 MED ORDER — EPHEDRINE 5 MG/ML INJ
INTRAVENOUS | Status: AC
  Filled 2020-01-15: qty 10

## 2020-01-15 MED ORDER — SCOPOLAMINE 1 MG/3DAYS TD PT72
MEDICATED_PATCH | TRANSDERMAL | Status: AC
  Filled 2020-01-15: qty 1

## 2020-01-15 MED ORDER — CEFAZOLIN SODIUM-DEXTROSE 2-4 GM/100ML-% IV SOLN
INTRAVENOUS | Status: AC
  Filled 2020-01-15: qty 100

## 2020-01-15 MED ORDER — KETOROLAC TROMETHAMINE 30 MG/ML IJ SOLN
INTRAMUSCULAR | Status: DC | PRN
  Administered 2020-01-15: 30 mg via INTRAVENOUS

## 2020-01-15 MED ORDER — FENTANYL CITRATE (PF) 100 MCG/2ML IJ SOLN
25.0000 ug | INTRAMUSCULAR | Status: DC | PRN
  Administered 2020-01-15: 25 ug via INTRAVENOUS

## 2020-01-15 MED ORDER — MIDAZOLAM HCL 2 MG/2ML IJ SOLN
INTRAMUSCULAR | Status: AC
  Filled 2020-01-15: qty 2

## 2020-01-15 MED ORDER — LEVOTHYROXINE SODIUM 25 MCG PO TABS
25.0000 ug | ORAL_TABLET | Freq: Every day | ORAL | Status: DC
  Filled 2020-01-15: qty 1

## 2020-01-15 MED ORDER — ACETAMINOPHEN 500 MG PO TABS
ORAL_TABLET | ORAL | Status: AC
  Filled 2020-01-15: qty 2

## 2020-01-15 MED ORDER — ALPRAZOLAM 0.5 MG PO TABS
0.2500 mg | ORAL_TABLET | Freq: Once | ORAL | Status: AC
  Administered 2020-01-15: 0.25 mg via ORAL

## 2020-01-15 MED ORDER — EPHEDRINE SULFATE 50 MG/ML IJ SOLN
INTRAMUSCULAR | Status: DC | PRN
  Administered 2020-01-15: 10 mg via INTRAVENOUS

## 2020-01-15 MED ORDER — MIDAZOLAM HCL 5 MG/5ML IJ SOLN
INTRAMUSCULAR | Status: DC | PRN
  Administered 2020-01-15: 2 mg via INTRAVENOUS

## 2020-01-15 MED ORDER — ACETAMINOPHEN 325 MG PO TABS
650.0000 mg | ORAL_TABLET | ORAL | Status: DC | PRN
  Administered 2020-01-15 – 2020-01-16 (×2): 650 mg via ORAL

## 2020-01-15 MED ORDER — SENNA 8.6 MG PO TABS
ORAL_TABLET | ORAL | Status: AC
  Filled 2020-01-15: qty 1

## 2020-01-15 MED ORDER — CEFAZOLIN SODIUM-DEXTROSE 2-4 GM/100ML-% IV SOLN
2.0000 g | INTRAVENOUS | Status: AC
  Administered 2020-01-15: 2 g via INTRAVENOUS

## 2020-01-15 MED ORDER — SENNA 8.6 MG PO TABS
1.0000 | ORAL_TABLET | Freq: Two times a day (BID) | ORAL | Status: DC
  Administered 2020-01-15 (×2): 8.6 mg via ORAL

## 2020-01-15 MED ORDER — SUGAMMADEX SODIUM 200 MG/2ML IV SOLN
INTRAVENOUS | Status: DC | PRN
Start: 2020-01-15 — End: 2020-01-15
  Administered 2020-01-15: 200 mg via INTRAVENOUS

## 2020-01-15 MED ORDER — DEXAMETHASONE SODIUM PHOSPHATE 10 MG/ML IJ SOLN
INTRAMUSCULAR | Status: AC
  Filled 2020-01-15: qty 1

## 2020-01-15 MED ORDER — IBUPROFEN 200 MG PO TABS: 600.0000 mg | ORAL_TABLET | Freq: Four times a day (QID) | ORAL | Status: DC | PRN

## 2020-01-15 MED ORDER — SIMETHICONE 80 MG PO CHEW: 80.0000 mg | CHEWABLE_TABLET | Freq: Four times a day (QID) | ORAL | Status: DC | PRN

## 2020-01-15 MED ORDER — ALPRAZOLAM 0.5 MG PO TABS
0.2500 mg | ORAL_TABLET | Freq: Every day | ORAL | Status: DC | PRN
  Administered 2020-01-15: 0.25 mg via ORAL

## 2020-01-15 SURGICAL SUPPLY — 40 items
APL PRP STRL LF DISP 70% ISPRP (MISCELLANEOUS) ×1
BLADE EXTENDED COATED 6.5IN (ELECTRODE) IMPLANT
BLADE HEX COATED 2.75 (ELECTRODE) IMPLANT
CANISTER SUCT 3000ML PPV (MISCELLANEOUS) ×3 IMPLANT
CHLORAPREP W/TINT 26 (MISCELLANEOUS) ×3 IMPLANT
CLOSURE WOUND 1/2 X4 (GAUZE/BANDAGES/DRESSINGS) ×1
CLOSURE WOUND 1/4X4 (GAUZE/BANDAGES/DRESSINGS)
DECANTER SPIKE VIAL GLASS SM (MISCELLANEOUS) IMPLANT
DRAPE WARM FLUID 44X44 (DRAPES) ×3 IMPLANT
DRSG OPSITE 6X11 MED (GAUZE/BANDAGES/DRESSINGS) ×2 IMPLANT
DRSG OPSITE POSTOP 4X10 (GAUZE/BANDAGES/DRESSINGS) ×3 IMPLANT
DRSG PAD ABDOMINAL 8X10 ST (GAUZE/BANDAGES/DRESSINGS) ×2 IMPLANT
DRSG TELFA 3X8 NADH (GAUZE/BANDAGES/DRESSINGS) ×3 IMPLANT
GAUZE 4X4 16PLY RFD (DISPOSABLE) ×2 IMPLANT
GLOVE BIO SURGEON STRL SZ8 (GLOVE) ×6 IMPLANT
GOWN STRL REUS W/TWL XL LVL3 (GOWN DISPOSABLE) ×6 IMPLANT
KIT TURNOVER CYSTO (KITS) ×3 IMPLANT
LIGASURE IMPACT 36 18CM CVD LR (INSTRUMENTS) ×2 IMPLANT
NEEDLE HYPO 22GX1.5 SAFETY (NEEDLE) ×2 IMPLANT
NS IRRIG 500ML POUR BTL (IV SOLUTION) ×9 IMPLANT
PACK ABDOMINAL (CUSTOM PROCEDURE TRAY) ×3 IMPLANT
PAD DRESSING TELFA 3X8 NADH (GAUZE/BANDAGES/DRESSINGS) IMPLANT
PAD OB MATERNITY 4.3X12.25 (PERSONAL CARE ITEMS) ×3 IMPLANT
SPONGE LAP 18X18 RF (DISPOSABLE) IMPLANT
SPONGE SURGIFOAM ABS GEL 12-7 (HEMOSTASIS) ×2 IMPLANT
STRIP CLOSURE SKIN 1/2X4 (GAUZE/BANDAGES/DRESSINGS) ×1 IMPLANT
STRIP CLOSURE SKIN 1/4X4 (GAUZE/BANDAGES/DRESSINGS) IMPLANT
SUT MNCRL 0 MO-4 VIOLET 18 CR (SUTURE) ×1 IMPLANT
SUT MNCRL 0 VIOLET 6X18 (SUTURE) ×1 IMPLANT
SUT MNCRL AB 0 CT1 27 (SUTURE) ×3 IMPLANT
SUT MONOCRYL 0 6X18 (SUTURE) ×3
SUT MONOCRYL 0 MO 4 18  CR/8 (SUTURE) ×3
SUT PDS AB 0 CTX 60 (SUTURE) ×6 IMPLANT
SUT PLAIN 2 0 XLH (SUTURE) ×3 IMPLANT
SUT VIC AB 4-0 KS 27 (SUTURE) ×3 IMPLANT
SYR BULB IRRIG 60ML STRL (SYRINGE) ×3 IMPLANT
SYR CONTROL 10ML LL (SYRINGE) ×2 IMPLANT
TOWEL OR 17X26 10 PK STRL BLUE (TOWEL DISPOSABLE) ×3 IMPLANT
TRAY FOLEY W/BAG SLVR 14FR (SET/KITS/TRAYS/PACK) ×3 IMPLANT
WATER STERILE IRR 500ML POUR (IV SOLUTION) ×3 IMPLANT

## 2020-01-15 NOTE — Transfer of Care (Signed)
Immediate Anesthesia Transfer of Care Note  Patient: Kathleen Stephenson  Procedure(s) Performed: HYSTERECTOMY ABDOMINAL WITH SALPINGO-OOPHORECTOMY (Bilateral Abdomen)  Patient Location: PACU  Anesthesia Type:General  Level of Consciousness: awake, alert  and oriented  Airway & Oxygen Therapy: Patient Spontanous Breathing and Patient connected to nasal cannula oxygen  Post-op Assessment: Report given to RN and Post -op Vital signs reviewed and stable  Post vital signs: Reviewed and stable  Last Vitals:  Vitals Value Taken Time  BP    Temp    Pulse 74 01/15/20 0923  Resp 15 01/15/20 0923  SpO2 100 % 01/15/20 0923  Vitals shown include unvalidated device data.  Last Pain:  Vitals:   01/15/20 0637  TempSrc: Oral  PainSc: 3          Complications: No complications documented.

## 2020-01-15 NOTE — Progress Notes (Signed)
01/15/2020  9:02 AM  PATIENT:  Kathleen Stephenson  49 y.o. female  PRE-OPERATIVE DIAGNOSIS:  fibroids, menorrhagia, dysmenorrhea  POST-OPERATIVE DIAGNOSIS:  fibroids, menorrhagia, dysmenorrhea  PROCEDURE:  Procedure(s) with comments: HYSTERECTOMY ABDOMINAL WITH SALPINGO-OOPHORECTOMY (Bilateral) - need bed  SURGEON:  Surgeon(s) and Role:    * Everlene Farrier, MD - Primary    * Morris, Megan, DO - Assisting  PHYSICIAN ASSISTANT:   ASSISTANTS: Morris DO   ANESTHESIA:   general  EBL:  100 mL   BLOOD ADMINISTERED:none  DRAINS: Urinary Catheter (Foley)   LOCAL MEDICATIONS USED:  MARCAINE   , Amount: 1/4% Marcaine 20 ml and OTHER Exparel 20 ml mixed 50/50 with marcaine subfascial and subcutaneous  SPECIMEN:  Source of Specimen:  uterus and bilateral tubes/ovaries  DISPOSITION OF SPECIMEN:  PATHOLOGY  COUNTS:  YES  TOURNIQUET:  * No tourniquets in log *  DICTATION: .Other Dictation: Dictation Number  [id number]  PLAN OF CARE: Admit to inpatient   PATIENT DISPOSITION:  PACU - hemodynamically stable.   Delay start of Pharmacological VTE agent (>24hrs) due to surgical blood loss or risk of bleeding: not applicable

## 2020-01-15 NOTE — Progress Notes (Signed)
Good pain relief with IV Toradol. Has taken Robaxin and tylenol. Had some mild nausea that was relieved with Zofran. Foley out>void x 2 in BR. Ambulate in hall x 2 with nurse.  Today's Vitals   01/15/20 1245 01/15/20 1357 01/15/20 1445 01/15/20 1700  BP: 121/71   130/74  Pulse: 77   74  Resp: 16   16  Temp: 98.2 F (36.8 C)   99 F (37.2 C)  TempSrc:      SpO2: 97%   95%  Weight:      Height:      PainSc: 6  Asleep 3  2    Body mass index is 29.9 kg/m.   Abdomen soft, BS good Dressing C&D LE NT  A/P: Stable        Toradol q6 hrs x 2 doses        Ibuprofen stopped        D/W discharge instructions with patient and husband        Will evaluate in am

## 2020-01-15 NOTE — Anesthesia Procedure Notes (Signed)
Procedure Name: Intubation Date/Time: 01/15/2020 7:29 AM Performed by: Bufford Spikes, CRNA Pre-anesthesia Checklist: Patient identified, Emergency Drugs available, Suction available and Patient being monitored Patient Re-evaluated:Patient Re-evaluated prior to induction Oxygen Delivery Method: Circle system utilized Preoxygenation: Pre-oxygenation with 100% oxygen Induction Type: IV induction Ventilation: Mask ventilation without difficulty Laryngoscope Size: Miller and 2 Grade View: Grade I Tube type: Oral Tube size: 7.0 mm Number of attempts: 1 Airway Equipment and Method: Stylet and Oral airway Placement Confirmation: ETT inserted through vocal cords under direct vision,  positive ETCO2 and breath sounds checked- equal and bilateral Secured at: 21 cm Tube secured with: Tape Dental Injury: Teeth and Oropharynx as per pre-operative assessment

## 2020-01-15 NOTE — Anesthesia Postprocedure Evaluation (Signed)
Anesthesia Post Note  Patient: Kathleen Stephenson  Procedure(s) Performed: HYSTERECTOMY ABDOMINAL WITH SALPINGO-OOPHORECTOMY (Bilateral Abdomen)     Patient location during evaluation: PACU Anesthesia Type: General Level of consciousness: awake and alert and oriented Pain management: pain level controlled Vital Signs Assessment: post-procedure vital signs reviewed and stable Respiratory status: spontaneous breathing, nonlabored ventilation and respiratory function stable Cardiovascular status: blood pressure returned to baseline Postop Assessment: no apparent nausea or vomiting Anesthetic complications: no   No complications documented.  Last Vitals:  Vitals:   01/15/20 0945 01/15/20 1000  BP: 115/76 119/83  Pulse: 74 78  Resp: 17 16  Temp:    SpO2: 94% 94%    Last Pain:  Vitals:   01/15/20 0637  TempSrc: Oral  PainSc: [place]

## 2020-01-15 NOTE — Op Note (Signed)
NAME: Kathleen Stephenson, Kathleen Stephenson MEDICAL RECORD PJ:[id number] ACCOUNT [id number] DATE OF BIRTH:[birth year] FACILITY: [facility] LOCATION: [facility] PHYSICIAN:Blessing Ozga E. Lulla Linville II, MD  OPERATIVE REPORT  DATE OF PROCEDURE:  01/15/2020  PREOPERATIVE DIAGNOSIS:  Uterine leiomyomata.  POSTOPERATIVE DIAGNOSIS:  Uterine leiomyomata.  PROCEDURE:  Total abdominal hysterectomy with bilateral salpingo-oophorectomy.  SURGEON:  Everlene Farrier, MD  ASSISTANT:  Linda Hedges, DO  ANESTHESIA:  General with endotracheal intubation, Shela Commons, MD   ESTIMATED BLOOD LOSS:  100 mL.  SPECIMENS:  Uterus, bilateral fallopian tubes and ovaries to pathology.  INDICATIONS AND CONSENT:  This patient is a 49 year old G0 P0 with a heavy and painful menses.  Details are dictated in history and physical.  Total abdominal hysterectomy with bilateral salpingo-oophorectomy has been discussed.  Potential risks and  complications were reviewed preoperatively including but not limited to infection, organ damage, bleeding requiring transfusion of blood products with HIV and hepatitis acquisition, DVT, PE, pneumonia, pelvic pain.  Issues in menopause were also  discussed.  The patient states she understands and agrees and consent was signed on the chart.  FINDINGS:  Uterus was occupied by at least three fibroids, the largest about 12 cm.  There are some adhesions to the left uterosacral ligament and the left ovary was partially adherent to the posterior aspect of the lower uterine segment.  Otherwise, the  ovaries appeared normal.  DESCRIPTION OF PROCEDURE:  The patient was taken to the operating room where she was identified, placed in the dorsal supine position.  A pillow was placed under her knees and it was assured that her low back is not under strain and she was comfortable.   She then undergoes anesthesia via endotracheal intubation.  She is prepped vaginally.  Foley catheter was placed and she was prepped with ChloraPrep and  vaginally was with Hibiclens.  After a 3-minute drying time, she was draped in sterile fashion.   Timeout was undertaken.  Skin was entered through a Pfannenstiel incision and dissection was carried out in layers to the peritoneum.  Peritoneum was taken down superiorly and inferiorly.  The uterus was elevated through the incision.  Then, using the  LigaSure bipolar cautery cutting instrument progressive bites were taken of the proximal ligaments down to the level of the lower uterine segment.  The bowel was packed away with sponges.  This allows control of the upper branch of the uterine arteries  bilaterally.  The cervix was then amputated to the level of the internal cervical os under careful observation, which allowed visualization of the remainder of the pelvis.  The bladder flap had already been advanced partially.  It was advanced further  bluntly and sharply.  Progressive straight and curved bites were taken and are ligated with 0 Monocryl suture.  This enters the vagina bilaterally and the cervix was removed entirely.  This closed the cuff completely.  After assuring hemostasis and  lavage the angle sutures are tied in the midline.  It should also be noted that the adhesions along the left uterosacral ligament were taken down gently and bluntly as the case progressed and this was well clear of the bowel.  The tubes and ovaries were  then removed bilaterally using the LigaSure.  Again, well clear of the pelvic sidewall and the course of the ureter.  Lavage was again carried out and all returned as clear.  Anterior peritoneum was closed in a running fashion with 0 Monocryl suture,  which was also used to reapproximate the pyramidalis muscle in the midline.  Anterior rectus fascia was closed in a running fashion with a 0 looped PDS.  The subfascial and subcutaneous layers were then injected with 20 mL of Exparel mixed with 20 mL of  0.25% plain Marcaine.  This is subfascial and subcutaneous bilaterally.   Subcutaneous layer was then closed with interrupted plain suture.  Skin was closed in a subcuticular fashion with 4-0 Vicryl on a Keith needle.  Benzoin, Steri-Strips, honeycomb and  pressure dressings were applied.  All counts were correct and the patient was taken to recovery room in stable condition.  CN/NUANCE  D:01/15/2020 T:01/15/2020 JOB:[id number]

## 2020-01-15 NOTE — Progress Notes (Signed)
Pt complaining about catheter being unbearable. She is very anxious. Dr. Gaetano Net notified and orders given.

## 2020-01-15 NOTE — Progress Notes (Signed)
No changes to H&P per patient history Reviewed procedure-total abdominal hysterectomy and bilateral salpingo oophorectomy.  All questions answered. She states she understands and agrees 

## 2020-01-16 ENCOUNTER — Encounter (HOSPITAL_BASED_OUTPATIENT_CLINIC_OR_DEPARTMENT_OTHER): Payer: Self-pay | Admitting: Obstetrics and Gynecology

## 2020-01-16 DIAGNOSIS — D219 Benign neoplasm of connective and other soft tissue, unspecified: Secondary | ICD-10-CM | POA: Diagnosis not present

## 2020-01-16 LAB — CBC
HCT: 33.6 % — ABNORMAL LOW (ref 36.0–46.0)
Hemoglobin: 11.1 g/dL — ABNORMAL LOW (ref 12.0–15.0)
MCH: 29.4 pg (ref 26.0–34.0)
MCHC: 33 g/dL (ref 30.0–36.0)
MCV: 89.1 fL (ref 80.0–100.0)
Platelets: 277 10*3/uL (ref 150–400)
RBC: 3.77 MIL/uL — ABNORMAL LOW (ref 3.87–5.11)
RDW: 13.2 % (ref 11.5–15.5)
WBC: 13.2 10*3/uL — ABNORMAL HIGH (ref 4.0–10.5)
nRBC: 0 % (ref 0.0–0.2)

## 2020-01-16 MED ORDER — IBUPROFEN 600 MG PO TABS: 600.0000 mg | ORAL_TABLET | Freq: Four times a day (QID) | ORAL | 0 refills | Status: AC | PRN

## 2020-01-16 MED ORDER — ACETAMINOPHEN 325 MG PO TABS
ORAL_TABLET | ORAL | Status: AC
  Filled 2020-01-16: qty 2

## 2020-01-16 MED ORDER — KETOROLAC TROMETHAMINE 30 MG/ML IJ SOLN
INTRAMUSCULAR | Status: AC
  Filled 2020-01-16: qty 1

## 2020-01-16 MED ORDER — OXYCODONE HCL 5 MG PO TABS: 5.0000 mg | ORAL_TABLET | Freq: Four times a day (QID) | ORAL | 0 refills | Status: AC | PRN

## 2020-01-16 MED ORDER — OXYCODONE HCL 5 MG PO TABS
ORAL_TABLET | ORAL | Status: AC
  Filled 2020-01-16: qty 1

## 2020-01-16 MED ORDER — METHOCARBAMOL 500 MG PO TABS: 500.0000 mg | ORAL_TABLET | Freq: Four times a day (QID) | ORAL | 0 refills | Status: AC | PRN

## 2020-01-16 NOTE — Discharge Instructions (Signed)
Abdominal Hysterectomy, Care After This sheet gives you information about how to care for yourself after your procedure. Your health care provider may also give you more specific instructions. If you have problems or questions, contact your health care provider. What can I expect after the procedure? After your procedure, it is common to have:  Pain.  Fatigue.  Poor appetite.  Less interest in sex.  Vaginal bleeding and discharge. You may need to use a sanitary napkin after this procedure. Follow these instructions at home: Bathing  Do not take baths, swim, or use a hot tub until your health care provider approves. Ask your health care provider if you can take showers. You may only be allowed to take sponge baths for bathing.  Keep the bandage (dressing) dry until your health care provider says it can be removed. Incision care   Follow instructions from your health care provider about how to take care of your incision. Make sure you: ? Wash your hands with soap and water before you change your bandage (dressing). If soap and water are not available, use hand sanitizer. ? Change your dressing as told by your health care provider. ? Leave stitches (sutures), skin glue, or adhesive strips in place. These skin closures may need to stay in place for 2 weeks or longer. If adhesive strip edges start to loosen and curl up, you may trim the loose edges. Do not remove adhesive strips completely unless your health care provider tells you to do that.  Check your incision area every day for signs of infection. Check for: ? Redness, swelling, or pain. ? Fluid or blood. ? Warmth. ? Pus or a bad smell. Activity  Do gentle, daily exercises as told by your health care provider. You may be told to take short walks every day and go farther each time.  Do not lift anything that is heavier than 10 lb (4.5 kg), or the limit that your health care provider tells you, until he or she says that it is  safe.  Do not drive or use heavy machinery while taking prescription pain medicine.  Do not drive for 24 hours if you were given a medicine to help you relax (sedative).  Follow your health care provider's instructions about exercise, driving, and general activities. Ask your health care provider what activities are safe for you. Lifestyle  Do not douche, use tampons, or have sex for at least 6 weeks or as told by your health care provider.  Do not drink alcohol until your health care provider approves.  Drink enough fluid to keep your urine clear or pale yellow.  Try to have someone at home with you for the first 1-2 weeks to help.  Do not use any products that contain nicotine or tobacco, such as cigarettes and e-cigarettes. These can delay healing. If you need help quitting, ask your health care provider. General instructions  Take over-the-counter and prescription medicines only as told by your health care provider.  Do not take aspirin or ibuprofen. These medicines can cause bleeding.  To prevent or treat constipation while you are taking prescription pain medicine, your health care provider may recommend that you: ? Drink enough fluid to keep your urine clear or pale yellow. ? Take over-the-counter or prescription medicines. ? Eat foods that are high in fiber, such as fresh fruits and vegetables, whole grains, and beans. ? Limit foods that are high in fat and processed sugars, such as fried and sweet foods.  Keep all   follow-up visits as told by your health care provider. This is important. Contact a health care provider if:  You have chills or fever.  You have redness, swelling, or pain around your incision.  You have fluid or blood coming from your incision.  Your incision feels warm to the touch.  You have pus or a bad smell coming from your incision.  Your incision breaks open.  You feel dizzy or light-headed.  You have pain or bleeding when you urinate.  You  have persistent diarrhea.  You have persistent nausea and vomiting.  You have abnormal vaginal discharge.  You have a rash.  You have any type of abnormal reaction or you develop an allergy to your medicine.  Your pain medicine does not help. Get help right away if:  You have a fever and your symptoms suddenly get worse.  You have severe abdominal pain.  You have shortness of breath.  You faint.  You have pain, swelling, or redness in your leg.  You have heavy vaginal bleeding with blood clots. Summary  After your procedure, it is common to have pain, fatigue and vaginal discharge.  Do not take baths, swim, or use a hot tub until your health care provider approves. Ask your health care provider if you can take showers. You may only be allowed to take sponge baths for bathing.  Follow your health care provider's instructions about exercise, driving, and general activities. Ask your health care provider what activities are safe for you.  Do not lift anything that is heavier than 10 lb (4.5 kg), or the limit that your health care provider tells you, until he or she says that it is safe.  Try to have someone at home with you for the first 1-2 weeks to help. This information is not intended to replace advice given to you by your health care provider. Make sure you discuss any questions you have with your health care provider. Document Revised: 08/16/2018 Document Reviewed: 07/01/2016 Elsevier Patient Education  2020 [employer]  

## 2020-01-16 NOTE — Progress Notes (Signed)
Had a good night, slept well + flatus, tolerating regular diet  Today's Vitals   01/16/20 0000 01/16/20 0219 01/16/20 0421 01/16/20 0615  BP:  115/73  123/73  Pulse:  78  82  Resp:  16  16  Temp:  98.5 F (36.9 C)  98.5 F (36.9 C)  TempSrc:      SpO2:  94%  94%  Weight:      Height:      PainSc: 3  2  2  3     Body mass index is 29.9 kg/m.   Abdomen soft with good BS Dressing C&D  Results for orders placed or performed during the hospital encounter of 01/15/20 (from the past 24 hour(s))  CBC     Status: Abnormal   Collection Time: 01/16/20  6:10 AM  Result Value Ref Range   WBC 13.2 (H) 4.0 - 10.5 K/uL   RBC 3.77 (L) 3.87 - 5.11 MIL/uL   Hemoglobin 11.1 (L) 12.0 - 15.0 g/dL   HCT 33.6 (L) 36 - 46 %   MCV 89.1 80.0 - 100.0 fL   MCH 29.4 26.0 - 34.0 pg   MCHC 33.0 30.0 - 36.0 g/dL   RDW 13.2 11.5 - 15.5 %   Platelets 277 150 - 400 K/uL   nRBC 0.0 0.0 - 0.2 %    A/P: Satisfactory recovery         D/C home         Instructions reviewed

## 2020-01-16 NOTE — Progress Notes (Signed)
Pressure dressing removed per MD orders. Honeycomb dressing still in place with small previously stained areas. Patient tolerated well; will continue to monitor.

## 2020-01-16 NOTE — Discharge Summary (Signed)
Physician Discharge Summary  Patient ID: Kathleen Stephenson MRN: [id number] DOB/AGE: 1971-03-25 49 y.o.  Admit date: 01/15/2020 Discharge date: 01/16/2020  Admission Diagnoses:Fibroids  Discharge Diagnoses:  Active Problems:   Fibroids   Discharged Condition: good  Hospital Course: Taken to OR for TAH/BSO. Postoperatively had good recovery of bowel and bladder function. Good pain relief with IV Toradol and Robaxin  Consults: None  Significant Diagnostic Studies: labs:  Results for orders placed or performed during the hospital encounter of 01/15/20 (from the past 24 hour(s))  CBC     Status: Abnormal   Collection Time: 01/16/20  6:10 AM  Result Value Ref Range   WBC 13.2 (H) 4.0 - 10.5 K/uL   RBC 3.77 (L) 3.87 - 5.11 MIL/uL   Hemoglobin 11.1 (L) 12.0 - 15.0 g/dL   HCT 33.6 (L) 36 - 46 %   MCV 89.1 80.0 - 100.0 fL   MCH 29.4 26.0 - 34.0 pg   MCHC 33.0 30.0 - 36.0 g/dL   RDW 13.2 11.5 - 15.5 %   Platelets 277 150 - 400 K/uL   nRBC 0.0 0.0 - 0.2 %    Treatments: surgery: TAH/BSO  Discharge Exam: Blood pressure 123/73, pulse 82, temperature 98.5 F (36.9 C), resp. rate 16, height 5\' 9"  (1.753 m), weight 91.9 kg, last menstrual period 12/22/2019, SpO2 94 %. General appearance: alert, cooperative and no distress GI: soft, non-tender; bowel sounds normal; no masses,  no organomegaly  Disposition: Discharge disposition: 01-Home or Self Care       Discharge Instructions    Discharge patient   Complete by: As directed    Discharge disposition: 01-Home or Self Care   Discharge patient date: 01/16/2020     Allergies as of 01/16/2020   No Known Allergies     Medication List    STOP taking these medications   metaxalone 800 MG tablet Commonly known as: SKELAXIN     TAKE these medications   acetaminophen 650 MG CR tablet Commonly known as: TYLENOL Take 650 mg by mouth every 8 (eight) hours as needed for pain.   ALPRAZolam 0.25 MG tablet Commonly known as:  XANAX Take 0.25 mg by mouth daily as needed for anxiety.   ascorbic acid 500 MG tablet Commonly known as: VITAMIN C Take 500 mg by mouth daily.   CALCIUM-D PO Take 2 tablets by mouth daily.   CLEAR EYES NATURAL TEARS OP Place 1 drop into both eyes daily as needed (dry eyes).   famotidine-calcium carbonate-magnesium hydroxide 10-800-165 MG chewable tablet Commonly known as: PEPCID COMPLETE Chew 1 tablet by mouth daily as needed (acid reflux).   FISH OIL PO Take 950 mg by mouth daily.   ibuprofen 600 MG tablet Commonly known as: ADVIL Take 1 tablet (600 mg total) by mouth every 6 (six) hours as needed. What changed:   medication strength  how much to take  reasons to take this   levothyroxine 25 MCG tablet Commonly known as: SYNTHROID Take 25 mcg by mouth daily before breakfast.   loratadine 10 MG tablet Commonly known as: CLARITIN Take 10 mg by mouth daily.   MAGNESIUM CITRATE PO Take 250 mg by mouth daily.   methocarbamol 500 MG tablet Commonly known as: ROBAXIN Take 1 tablet (500 mg total) by mouth every 6 (six) hours as needed for muscle spasms.   multivitamin with minerals Tabs tablet Take 1 tablet by mouth daily.   oxyCODONE 5 MG immediate release tablet Commonly known as: Oxy IR/ROXICODONE Take  1 tablet (5 mg total) by mouth every 6 (six) hours as needed for moderate pain.   sulfamethoxazole-trimethoprim 800-160 MG tablet Commonly known as: BACTRIM DS Take 0.5 tablets by mouth at bedtime.   Tazorac 0.1 % gel Generic drug: tazarotene Apply 1 application topically daily as needed (acne).        Signed: Shon Millet II 01/16/2020, 6:49 AM

## 2020-01-17 LAB — SURGICAL PATHOLOGY

## 2020-07-11 ENCOUNTER — Other Ambulatory Visit: Payer: Self-pay | Admitting: Family Medicine

## 2020-07-11 DIAGNOSIS — Z1231 Encounter for screening mammogram for malignant neoplasm of breast: Secondary | ICD-10-CM

## 2020-08-26 ENCOUNTER — Ambulatory Visit: Payer: BC Managed Care – PPO

## 2020-10-10 ENCOUNTER — Inpatient Hospital Stay: Admission: RE | Admit: 2020-10-10 | Payer: Self-pay | Source: Ambulatory Visit

## 2020-12-03 ENCOUNTER — Other Ambulatory Visit: Payer: Self-pay

## 2020-12-03 ENCOUNTER — Ambulatory Visit
Admission: RE | Admit: 2020-12-03 | Discharge: 2020-12-03 | Disposition: A | Discharge status: Home / Self Care | Payer: BC Managed Care – PPO | Source: Ambulatory Visit | Attending: Family Medicine | Admitting: Family Medicine

## 2020-12-03 DIAGNOSIS — Z1231 Encounter for screening mammogram for malignant neoplasm of breast: Secondary | ICD-10-CM

## 2021-10-28 ENCOUNTER — Other Ambulatory Visit: Payer: Self-pay | Admitting: Family Medicine

## 2021-10-28 DIAGNOSIS — Z1231 Encounter for screening mammogram for malignant neoplasm of breast: Secondary | ICD-10-CM

## 2021-12-29 ENCOUNTER — Ambulatory Visit
Admission: RE | Admit: 2021-12-29 | Discharge: 2021-12-29 | Disposition: A | Discharge status: Home / Self Care | Payer: BC Managed Care – PPO | Source: Ambulatory Visit | Attending: Family Medicine | Admitting: Family Medicine

## 2021-12-29 DIAGNOSIS — Z1231 Encounter for screening mammogram for malignant neoplasm of breast: Secondary | ICD-10-CM

## 2022-02-13 IMAGING — MG MM DIGITAL SCREENING BILAT W/ TOMO AND CAD
6 of 10 series · 6 of 30 positions shown · non-contrast
Comparison: Previous exam(s).

CLINICAL DATA: Screening.

EXAM:
DIGITAL SCREENING BILATERAL MAMMOGRAM WITH TOMOSYNTHESIS AND CAD
TECHNIQUE: Bilateral screening digital craniocaudal and mediolateral oblique
mammograms were obtained. Bilateral screening digital breast
tomosynthesis was performed. The images were evaluated with
computer-aided detection.

[L CC synth-2D (1 of 2)]
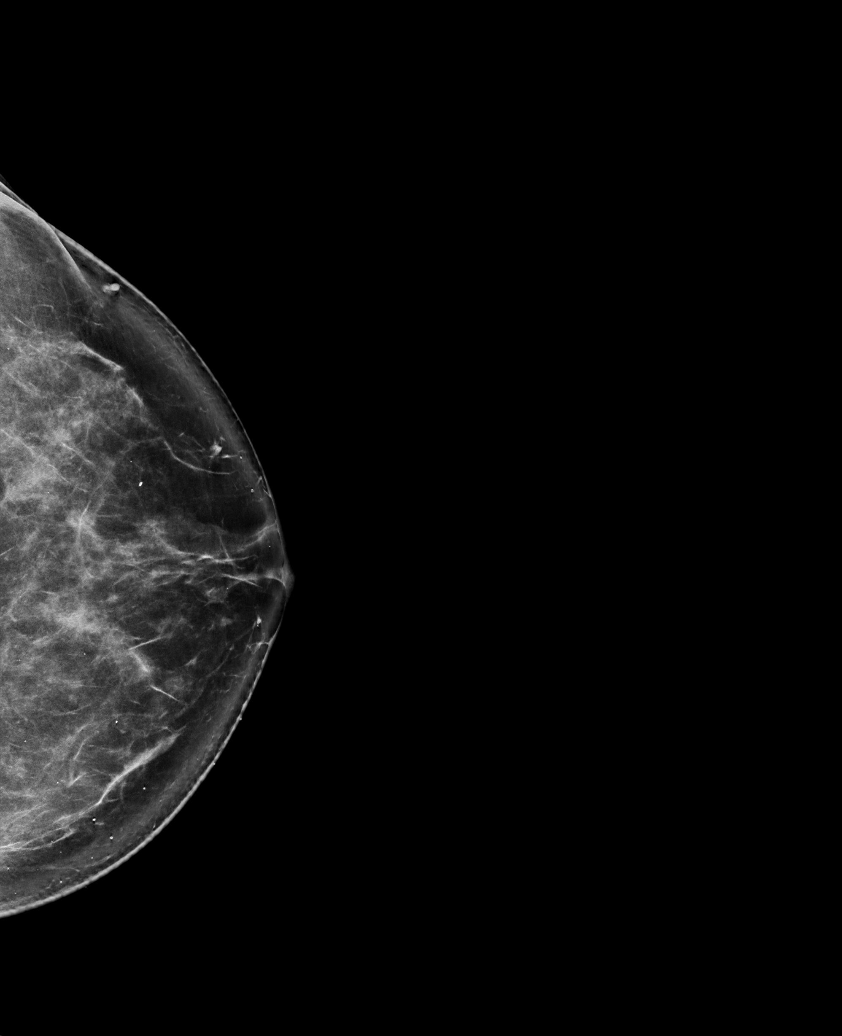

[R CC synth-2D]
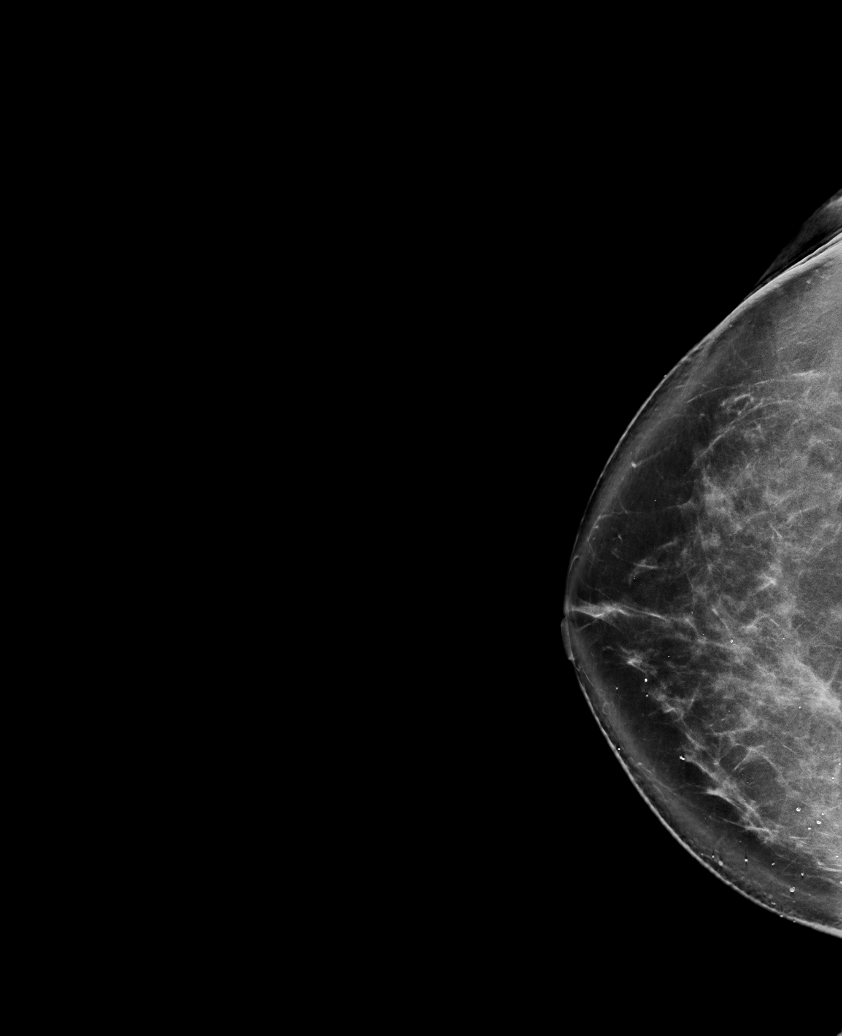

[L CC synth-2D (2 of 2)]
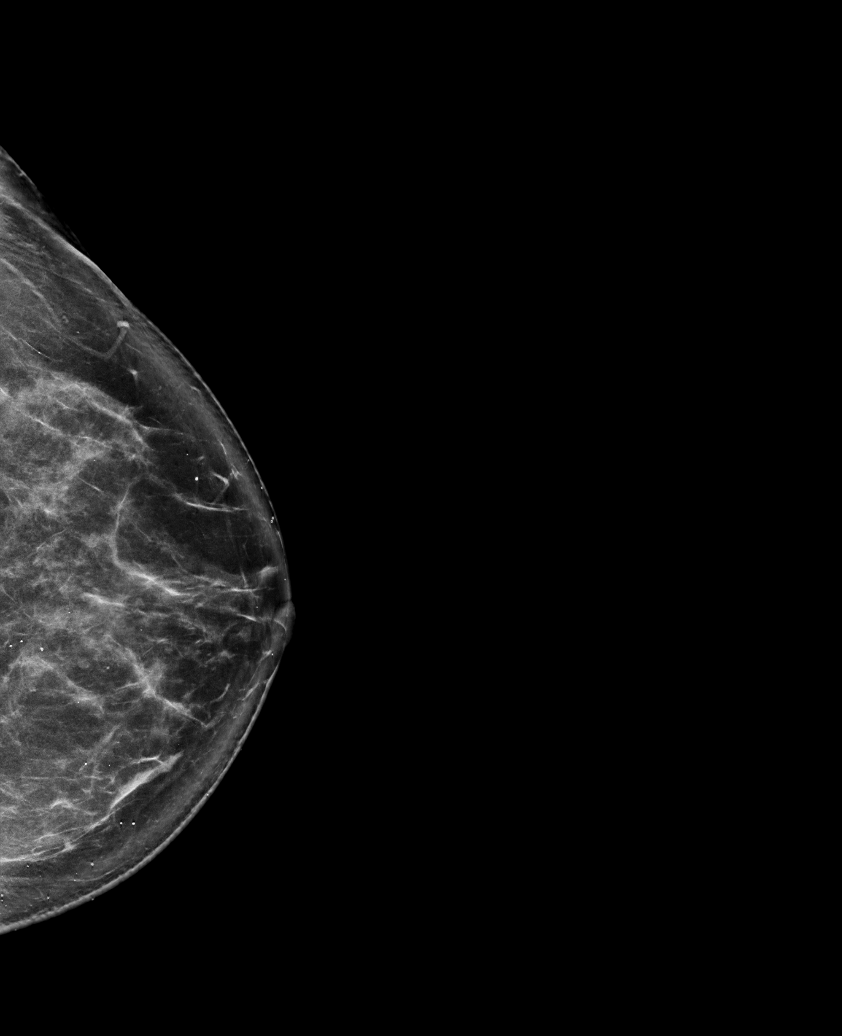

[R MLO synth-2D]
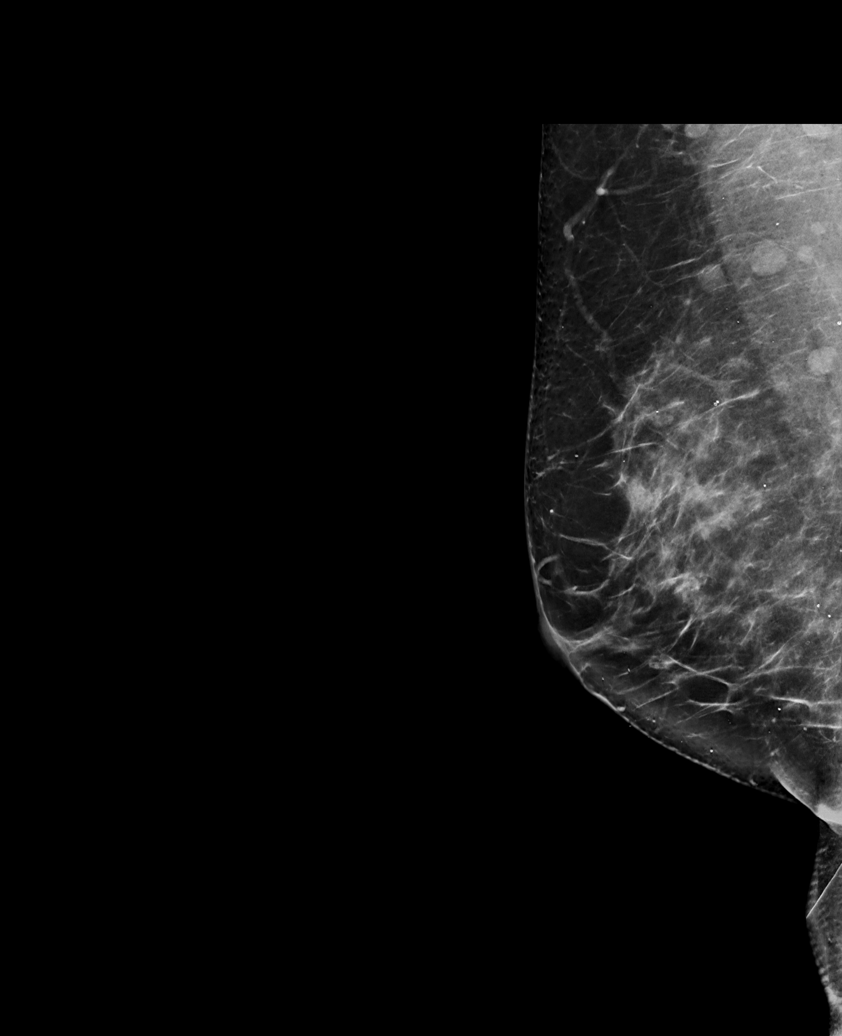

[L MLO synth-2D]
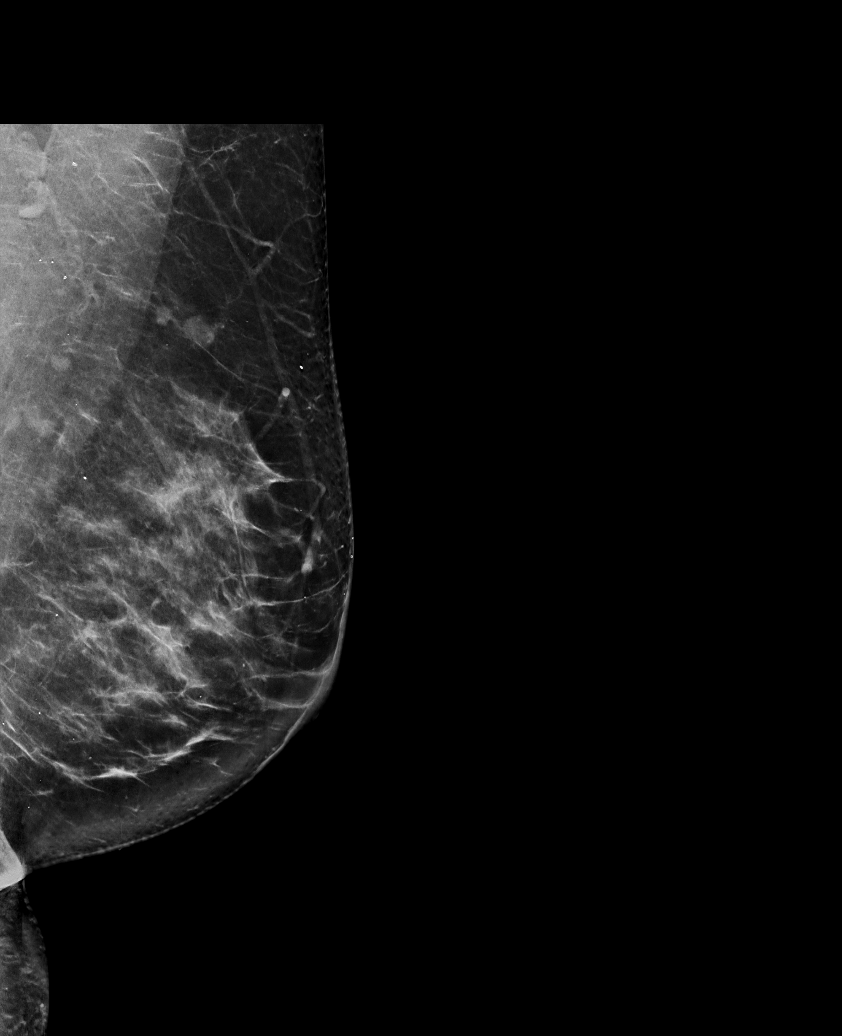

[R MLO tomo · tomo slice 51/100.0]
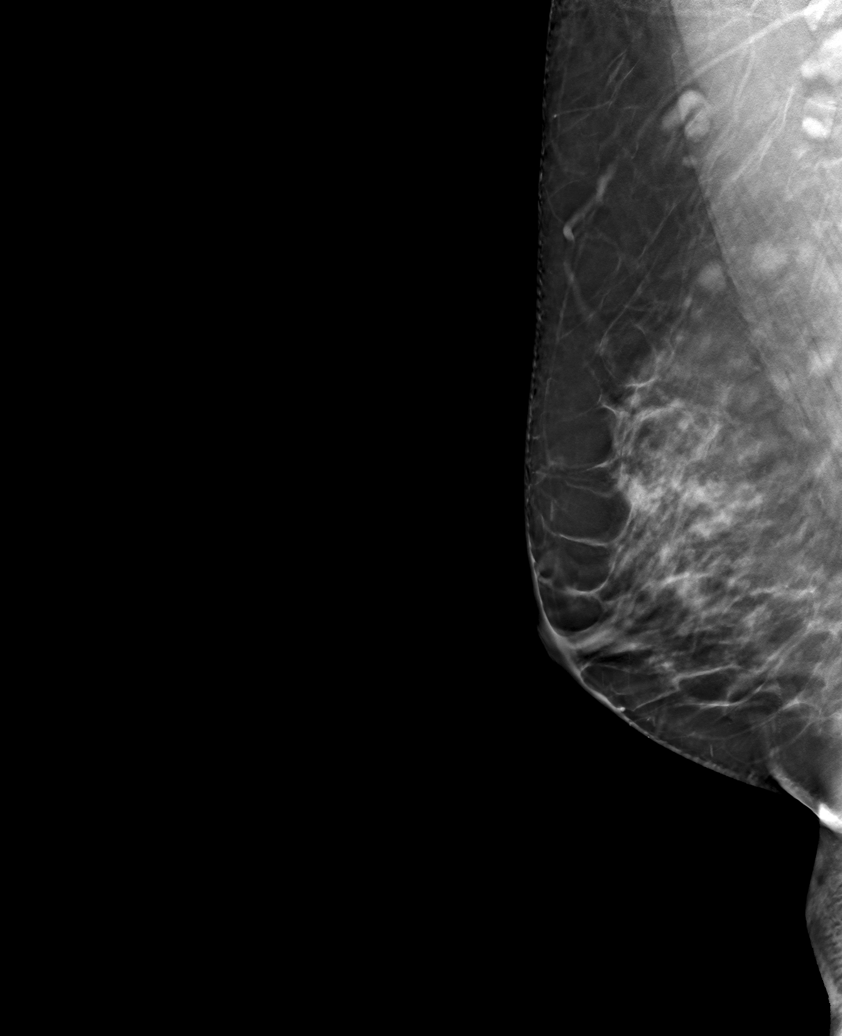

[6 of 30 positions shown; findings below may reference images not displayed]

ACR Breast Density Category c: The breast tissue is heterogeneously
dense, which may obscure small masses.
FINDINGS: There are no findings suspicious for malignancy. The images were
evaluated with computer-aided detection.
IMPRESSION: No mammographic evidence of malignancy. A result letter of this
screening mammogram will be mailed directly to the patient.

RECOMMENDATION:
Screening mammogram in one year. (Code:T4-5-GWO)

BI-RADS CATEGORY  1: Negative.

## 2022-06-25 ENCOUNTER — Other Ambulatory Visit: Payer: Self-pay | Admitting: Family Medicine

## 2022-06-25 DIAGNOSIS — E2839 Other primary ovarian failure: Secondary | ICD-10-CM

## 2022-11-24 ENCOUNTER — Other Ambulatory Visit: Payer: Self-pay | Admitting: Family Medicine

## 2022-11-24 DIAGNOSIS — Z1231 Encounter for screening mammogram for malignant neoplasm of breast: Secondary | ICD-10-CM

## 2022-12-28 ENCOUNTER — Ambulatory Visit
Admission: RE | Admit: 2022-12-28 | Discharge: 2022-12-28 | Disposition: A | Discharge status: Home / Self Care | Payer: BC Managed Care – PPO | Source: Ambulatory Visit | Attending: Family Medicine | Admitting: Family Medicine

## 2022-12-28 DIAGNOSIS — E2839 Other primary ovarian failure: Secondary | ICD-10-CM

## 2023-01-01 ENCOUNTER — Ambulatory Visit
Admission: RE | Admit: 2023-01-01 | Discharge: 2023-01-01 | Disposition: A | Discharge status: Home / Self Care | Payer: BC Managed Care – PPO | Source: Ambulatory Visit | Attending: Family Medicine | Admitting: Family Medicine

## 2023-01-01 DIAGNOSIS — Z1231 Encounter for screening mammogram for malignant neoplasm of breast: Secondary | ICD-10-CM

## 2023-11-23 ENCOUNTER — Other Ambulatory Visit: Payer: Self-pay | Admitting: Family Medicine

## 2023-11-23 DIAGNOSIS — Z1231 Encounter for screening mammogram for malignant neoplasm of breast: Secondary | ICD-10-CM

## 2024-01-03 ENCOUNTER — Encounter: Payer: Self-pay | Admitting: Radiology

## 2024-01-03 ENCOUNTER — Ambulatory Visit
Admission: RE | Admit: 2024-01-03 | Discharge: 2024-01-03 | Disposition: A | Discharge status: Home / Self Care | Payer: Self-pay | Source: Ambulatory Visit | Attending: Family Medicine | Admitting: Family Medicine

## 2024-01-03 DIAGNOSIS — Z1231 Encounter for screening mammogram for malignant neoplasm of breast: Secondary | ICD-10-CM

## 2024-01-07 ENCOUNTER — Other Ambulatory Visit: Payer: Self-pay | Admitting: Family Medicine

## 2024-01-07 DIAGNOSIS — R928 Other abnormal and inconclusive findings on diagnostic imaging of breast: Secondary | ICD-10-CM

## 2024-01-18 ENCOUNTER — Other Ambulatory Visit

## 2024-01-18 ENCOUNTER — Encounter

## 2024-01-20 ENCOUNTER — Ambulatory Visit

## 2024-01-20 ENCOUNTER — Ambulatory Visit
Admission: RE | Admit: 2024-01-20 | Discharge: 2024-01-20 | Disposition: A | Discharge status: Home / Self Care | Source: Ambulatory Visit | Attending: Family Medicine | Admitting: Family Medicine

## 2024-01-20 DIAGNOSIS — R928 Other abnormal and inconclusive findings on diagnostic imaging of breast: Secondary | ICD-10-CM
